# Patient Record
Sex: Male | Born: 1970 | Race: White | Hispanic: No | Marital: Single | Smoking: Former smoker
Health system: Southern US, Community
[De-identification: ages and names within clinical notes are randomized; demographics above are authoritative.]

## PROBLEM LIST (undated history)

## (undated) DIAGNOSIS — C801 Malignant (primary) neoplasm, unspecified: Secondary | ICD-10-CM

## (undated) HISTORY — PX: COLON SURGERY: SHX602

## (undated) HISTORY — DX: Malignant (primary) neoplasm, unspecified: C80.1

---

## 2004-09-14 HISTORY — PX: COLON SURGERY: SHX602

## 2004-10-15 HISTORY — PX: COLON SURGERY: SHX602

## 2005-05-28 ENCOUNTER — Emergency Department: Payer: Self-pay | Admitting: Emergency Medicine

## 2005-06-02 ENCOUNTER — Ambulatory Visit: Payer: Self-pay | Admitting: Internal Medicine

## 2005-06-11 ENCOUNTER — Ambulatory Visit: Payer: Self-pay | Admitting: Internal Medicine

## 2005-06-17 ENCOUNTER — Ambulatory Visit: Payer: Self-pay | Admitting: Internal Medicine

## 2005-07-02 ENCOUNTER — Ambulatory Visit: Payer: Self-pay | Admitting: Oncology

## 2005-07-15 ENCOUNTER — Ambulatory Visit: Payer: Self-pay | Admitting: Oncology

## 2005-08-14 ENCOUNTER — Ambulatory Visit: Payer: Self-pay | Admitting: Oncology

## 2005-09-14 ENCOUNTER — Ambulatory Visit: Payer: Self-pay | Admitting: Oncology

## 2005-12-09 ENCOUNTER — Ambulatory Visit: Payer: Self-pay | Admitting: Oncology

## 2005-12-13 ENCOUNTER — Ambulatory Visit: Payer: Self-pay | Admitting: Oncology

## 2006-01-12 ENCOUNTER — Ambulatory Visit: Payer: Self-pay | Admitting: Oncology

## 2006-02-12 ENCOUNTER — Ambulatory Visit: Payer: Self-pay | Admitting: Oncology

## 2006-03-14 ENCOUNTER — Ambulatory Visit: Payer: Self-pay | Admitting: Oncology

## 2006-04-14 ENCOUNTER — Ambulatory Visit: Payer: Self-pay | Admitting: Oncology

## 2006-05-15 ENCOUNTER — Ambulatory Visit: Payer: Self-pay | Admitting: Oncology

## 2006-08-23 ENCOUNTER — Ambulatory Visit: Payer: Self-pay | Admitting: Oncology

## 2006-09-14 ENCOUNTER — Ambulatory Visit: Payer: Self-pay | Admitting: Oncology

## 2006-11-16 DIAGNOSIS — Z720 Tobacco use: Secondary | ICD-10-CM | POA: Insufficient documentation

## 2006-12-14 ENCOUNTER — Ambulatory Visit: Payer: Self-pay | Admitting: Oncology

## 2006-12-15 ENCOUNTER — Ambulatory Visit: Payer: Self-pay | Admitting: Oncology

## 2006-12-20 ENCOUNTER — Ambulatory Visit: Payer: Self-pay | Admitting: Oncology

## 2007-01-13 ENCOUNTER — Ambulatory Visit: Payer: Self-pay | Admitting: Oncology

## 2007-04-15 ENCOUNTER — Ambulatory Visit: Payer: Self-pay | Admitting: Oncology

## 2007-04-25 ENCOUNTER — Ambulatory Visit: Payer: Self-pay | Admitting: Oncology

## 2007-05-16 ENCOUNTER — Ambulatory Visit: Payer: Self-pay | Admitting: Oncology

## 2007-06-21 ENCOUNTER — Ambulatory Visit: Payer: Self-pay | Admitting: Gastroenterology

## 2008-07-23 ENCOUNTER — Ambulatory Visit: Payer: Self-pay | Admitting: Oncology

## 2008-07-25 ENCOUNTER — Ambulatory Visit: Payer: Self-pay | Admitting: Oncology

## 2008-08-14 ENCOUNTER — Ambulatory Visit: Payer: Self-pay | Admitting: Oncology

## 2009-02-12 ENCOUNTER — Ambulatory Visit: Payer: Self-pay | Admitting: Oncology

## 2009-02-13 ENCOUNTER — Ambulatory Visit: Payer: Self-pay | Admitting: Oncology

## 2009-03-14 ENCOUNTER — Ambulatory Visit: Payer: Self-pay | Admitting: Oncology

## 2009-12-13 ENCOUNTER — Ambulatory Visit: Payer: Self-pay | Admitting: Oncology

## 2009-12-23 ENCOUNTER — Ambulatory Visit: Payer: Self-pay | Admitting: Oncology

## 2010-01-12 ENCOUNTER — Ambulatory Visit: Payer: Self-pay | Admitting: Oncology

## 2012-11-21 ENCOUNTER — Other Ambulatory Visit: Payer: Self-pay | Admitting: Unknown Physician Specialty

## 2012-11-22 LAB — CLOSTRIDIUM DIFFICILE BY PCR

## 2012-12-29 ENCOUNTER — Ambulatory Visit: Payer: Self-pay | Admitting: Unknown Physician Specialty

## 2013-06-02 ENCOUNTER — Ambulatory Visit: Payer: Self-pay | Admitting: Family Medicine

## 2014-09-20 ENCOUNTER — Ambulatory Visit: Payer: Self-pay | Admitting: Oncology

## 2014-09-20 DIAGNOSIS — R197 Diarrhea, unspecified: Secondary | ICD-10-CM | POA: Insufficient documentation

## 2014-09-20 DIAGNOSIS — K91858 Other complications of intestinal pouch: Secondary | ICD-10-CM | POA: Insufficient documentation

## 2014-09-20 DIAGNOSIS — Z9889 Other specified postprocedural states: Secondary | ICD-10-CM

## 2014-09-20 DIAGNOSIS — Z85048 Personal history of other malignant neoplasm of rectum, rectosigmoid junction, and anus: Secondary | ICD-10-CM | POA: Insufficient documentation

## 2014-09-20 DIAGNOSIS — D126 Benign neoplasm of colon, unspecified: Secondary | ICD-10-CM | POA: Insufficient documentation

## 2014-09-20 DIAGNOSIS — D369 Benign neoplasm, unspecified site: Secondary | ICD-10-CM | POA: Insufficient documentation

## 2014-10-08 ENCOUNTER — Ambulatory Visit: Payer: Self-pay | Admitting: Oncology

## 2014-10-08 LAB — COMPREHENSIVE METABOLIC PANEL
ALBUMIN: 3.6 g/dL (ref 3.4–5.0)
ANION GAP: 4 — AB (ref 7–16)
AST: 37 U/L (ref 15–37)
Alkaline Phosphatase: 61 U/L
BILIRUBIN TOTAL: 0.2 mg/dL (ref 0.2–1.0)
BUN: 14 mg/dL (ref 7–18)
CREATININE: 1.23 mg/dL (ref 0.60–1.30)
Calcium, Total: 8.5 mg/dL (ref 8.5–10.1)
Chloride: 108 mmol/L — ABNORMAL HIGH (ref 98–107)
Co2: 27 mmol/L (ref 21–32)
EGFR (African American): >60
EGFR (Non-African Amer.): >60
GLUCOSE: 89 mg/dL (ref 65–99)
Osmolality: 277 (ref 275–301)
POTASSIUM: 4.1 mmol/L (ref 3.5–5.1)
SGPT (ALT): 45 U/L
SODIUM: 139 mmol/L (ref 136–145)
TOTAL PROTEIN: 7 g/dL (ref 6.4–8.2)

## 2014-10-08 LAB — CBC CANCER CENTER
BASOS PCT: 0.4 %
Basophil #: 0 x10 3/mm (ref 0.0–0.1)
EOS ABS: 0.2 x10 3/mm (ref 0.0–0.7)
Eosinophil %: 2.2 %
HCT: 44.6 % (ref 40.0–52.0)
HGB: 15.1 g/dL (ref 13.0–18.0)
LYMPHS ABS: 2.6 x10 3/mm (ref 1.0–3.6)
Lymphocyte %: 22.8 %
MCH: 30.6 pg (ref 26.0–34.0)
MCHC: 33.8 g/dL (ref 32.0–36.0)
MCV: 91 fL (ref 80–100)
MONO ABS: 0.8 x10 3/mm (ref 0.2–1.0)
Monocyte %: 7.4 %
NEUTROS ABS: 7.7 x10 3/mm — AB (ref 1.4–6.5)
Neutrophil %: 67.2 %
Platelet: 218 x10 3/mm (ref 150–440)
RBC: 4.92 10*6/uL (ref 4.40–5.90)
RDW: 13.3 % (ref 11.5–14.5)
WBC: 11.4 x10 3/mm — AB (ref 3.8–10.6)

## 2014-10-09 LAB — CEA: CEA: 4.8 ng/mL — ABNORMAL HIGH (ref 0.0–4.7)

## 2014-10-15 ENCOUNTER — Ambulatory Visit: Payer: Self-pay | Admitting: Oncology

## 2014-11-28 ENCOUNTER — Ambulatory Visit
Admit: 2014-11-28 | Disposition: A | Discharge status: Home / Self Care | Payer: Self-pay | Attending: Oncology | Admitting: Oncology

## 2014-12-14 ENCOUNTER — Ambulatory Visit
Admit: 2014-12-14 | Disposition: A | Discharge status: Home / Self Care | Payer: Self-pay | Attending: Oncology | Admitting: Oncology

## 2015-03-11 ENCOUNTER — Ambulatory Visit (INDEPENDENT_AMBULATORY_CARE_PROVIDER_SITE_OTHER): Payer: Federal, State, Local not specified - PPO | Admitting: Family Medicine

## 2015-03-11 ENCOUNTER — Encounter: Payer: Self-pay | Admitting: Family Medicine

## 2015-03-11 VITALS — BP 108/56 | HR 79 | Temp 98.1°F | Resp 16 | Ht 75.0 in | Wt 208.8 lb

## 2015-03-11 DIAGNOSIS — Z85038 Personal history of other malignant neoplasm of large intestine: Secondary | ICD-10-CM | POA: Insufficient documentation

## 2015-03-11 DIAGNOSIS — Z0000 Encounter for general adult medical examination without abnormal findings: Secondary | ICD-10-CM | POA: Diagnosis not present

## 2015-03-11 NOTE — Progress Notes (Signed)
Subjective:     Patient ID: Sean Bates, male   DOB: [birth year], 44 y.o.   MRN: [id number]  HPI  Chief Complaint  Patient presents with  . Annual Exam    Patient is present in office today for his yearly exam he states that he has no questions or concerns at this time.   States he needs form filled out for [employer]: Feeling well; Does not wish to stop smoking at this time. HEENT: regular eye exam. He is pending extraction of his teeth for dentures. Cardiovascular: no chest pain, shortness of breath, or palpitations GI: no heartburn, no change in bowel habits or blood in the stool. He has consulted with G.I. Doctor in [place] who has rx'ed opium to slow down his frequent diarrhea from 15-20 x day to 8-15 x day. 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 GU: nocturia x 1, no change in bladder habits  Psychiatric: not depressed; PHQ 2: 0 Musculoskeletal: no joint pain. Sees a chiropractor for back pain and uses muscle relaxants occasionally.     Objective:   Physical Exam Eyes: PERRLA Neck: no thyromegaly, tenderness or nodules,  ENT: TM's intact without inflammation; No tonsillar enlargement or exudate, Lungs: Clear Heart : RRR without murmur or gallop Abd: bowel sounds present, soft, non-tender, no organomegaly Skin: no atypical lesions noted.    Assessment:    1. Annual physical exam - Lipid panel; Future - Comprehensive metabolic panel; Future    Plan:    Further f/u pending lab work. [employer] form completed

## 2015-03-11 NOTE — Patient Instructions (Signed)
We will call you with lab results.       

## 2015-10-11 ENCOUNTER — Other Ambulatory Visit: Payer: Self-pay | Admitting: *Deleted

## 2015-10-11 DIAGNOSIS — Z85038 Personal history of other malignant neoplasm of large intestine: Secondary | ICD-10-CM

## 2015-10-14 ENCOUNTER — Inpatient Hospital Stay: Payer: Federal, State, Local not specified - PPO

## 2015-10-14 ENCOUNTER — Inpatient Hospital Stay: Payer: Federal, State, Local not specified - PPO | Admitting: Oncology

## 2015-11-19 ENCOUNTER — Ambulatory Visit: Payer: Self-pay | Admitting: Oncology

## 2015-11-19 ENCOUNTER — Other Ambulatory Visit: Payer: Self-pay

## 2015-12-17 ENCOUNTER — Ambulatory Visit (INDEPENDENT_AMBULATORY_CARE_PROVIDER_SITE_OTHER): Payer: Federal, State, Local not specified - PPO | Admitting: Family Medicine

## 2015-12-17 ENCOUNTER — Encounter: Payer: Self-pay | Admitting: Family Medicine

## 2015-12-17 VITALS — BP 96/58 | HR 64 | Temp 98.2°F | Resp 16 | Wt 205.0 lb

## 2015-12-17 DIAGNOSIS — J0100 Acute maxillary sinusitis, unspecified: Secondary | ICD-10-CM | POA: Diagnosis not present

## 2015-12-17 DIAGNOSIS — Z85038 Personal history of other malignant neoplasm of large intestine: Secondary | ICD-10-CM | POA: Diagnosis not present

## 2015-12-17 MED ORDER — CEFDINIR 300 MG PO CAPS: 300.0000 mg | ORAL_CAPSULE | Freq: Two times a day (BID) | ORAL | Status: DC

## 2015-12-17 NOTE — Patient Instructions (Signed)
Discussed use of Mucinex D. 

## 2015-12-17 NOTE — Progress Notes (Signed)
Subjective:     Patient ID: Sean Bates, male   DOB: 12-Apr-1971, 45 y.o.   MRN: [id number]  HPI  Chief Complaint  Patient presents with  . Facial Pain    Paitent comes in office today with concerns of sinus pain on the right side of his face. Patient reports that in begining of March he had tooth extraced and was placed on two rounds of antibiotics but states that it has not helped none with sinus pain. Patient reports that pain is located exactly where tooth was extracted.   States after the extraction there was communication between the tooth cavity and the right maxillary sinus. Reports persistent right sided sinus pain, left sided congestion and purulent drainage. Reports treatment with amoxicillin with transient improvement.   Review of Systems     Objective:   Physical Exam  Constitutional: He appears well-developed and well-nourished. No distress.  Ears: T.M's intact without inflammation Sinuses: right maxillary sinus tenderness Throat: no tonsillar enlargement or exudate. There is a deep tooth cavity apparent in his right upper molar area. Neck: no cervical adenopathy Lungs: clear    Assessment:    1. Acute maxillary sinusitis, recurrence not specified - cefdinir (OMNICEF) 300 MG capsule; Take 1 capsule (300 mg total) by mouth 2 (two) times daily.  Dispense: 20 capsule; Refill: 0    Plan:    Discussed use of Mucinex D. Minimize use of nasal decongestant sprays.

## 2016-03-23 ENCOUNTER — Ambulatory Visit (INDEPENDENT_AMBULATORY_CARE_PROVIDER_SITE_OTHER): Payer: Federal, State, Local not specified - PPO | Admitting: Family Medicine

## 2016-03-23 ENCOUNTER — Encounter: Payer: Self-pay | Admitting: Family Medicine

## 2016-03-23 VITALS — BP 112/60 | HR 68 | Temp 97.9°F | Resp 16 | Ht 76.0 in | Wt 203.0 lb

## 2016-03-23 DIAGNOSIS — Z0000 Encounter for general adult medical examination without abnormal findings: Secondary | ICD-10-CM

## 2016-03-23 DIAGNOSIS — Z13220 Encounter for screening for lipoid disorders: Secondary | ICD-10-CM

## 2016-03-23 DIAGNOSIS — Z131 Encounter for screening for diabetes mellitus: Secondary | ICD-10-CM

## 2016-03-23 NOTE — Progress Notes (Signed)
Subjective:     Patient ID: Sean Bates, male   DOB: [birth year], 45 y.o.   MRN: [id number]  HPI  Chief Complaint  Patient presents with  . Annual Exam  Also wishes [employer] Form completed. States he is working on smoking cessation with the use of an otc herbal remedy. Reports trying other rx and otc products in the past with little improvement. 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   Review of Systems General: Feeling well, Tdap in 2009 HEENT: States he uses otc reading glasses-last eye exam 2 years ago. He is waiting for full upper dentures and partial in his lower teeth. Cardiovascular: no chest pain, shortness of breath, or palpitations GI: no heartburn, no change in bowel habits. States he sees G.I. Sean Bates) annually for colo-rectal cancer surveillance. Sees a doctor in [place] as well for ex of Opium extract to help lessen stool frequency. He elected not to have a colostomy bag after colectomy. GU: nocturia x 0, no change in bladder habits, no hernia Psychiatric: not depressed Musculoskeletal: Occasionally sees a chiropractor, Dr. Emmit Bates, for back pains.    Objective:   Physical Exam Eyes: PERRLA Neck: no thyromegaly, tenderness or nodules,  No cervical adenopathy ENT: TM's intact without inflammation; No tonsillar enlargement or exudate, Lungs: Clear Heart : RRR without murmur or gallop Abd: bowel sounds present, soft, non-tender, no organomegaly Extremities: no edema     Assessment:    1. Annual physical exam  2. Screening for cholesterol level - Lipid panel  3. Screening for diabetes mellitus - Comprehensive metabolic panel    Plan:    Further f/u pending lab work.

## 2016-03-23 NOTE — Patient Instructions (Signed)
We will call you with the lab results. 

## 2016-03-25 ENCOUNTER — Ambulatory Visit (INDEPENDENT_AMBULATORY_CARE_PROVIDER_SITE_OTHER): Payer: Federal, State, Local not specified - PPO | Admitting: Family Medicine

## 2016-03-25 ENCOUNTER — Encounter: Payer: Self-pay | Admitting: Family Medicine

## 2016-03-25 VITALS — BP 100/72 | HR 76 | Temp 97.7°F | Resp 16 | Wt 202.0 lb

## 2016-03-25 DIAGNOSIS — W57XXXA Bitten or stung by nonvenomous insect and other nonvenomous arthropods, initial encounter: Secondary | ICD-10-CM

## 2016-03-25 DIAGNOSIS — S90862A Insect bite (nonvenomous), left foot, initial encounter: Secondary | ICD-10-CM

## 2016-03-25 MED ORDER — FLUOCINONIDE 0.05 % EX CREA: 1.0000 "application " | TOPICAL_CREAM | Freq: Three times a day (TID) | CUTANEOUS | Status: DC

## 2016-03-25 NOTE — Patient Instructions (Signed)
Let us know if flu like symptoms or rash is not improving.

## 2016-03-25 NOTE — Progress Notes (Signed)
Subjective:     Patient ID: Sean Bates, male   DOB: [birth year], 45 y.o.   MRN: [id number]  HPI  Chief Complaint  Patient presents with  . Insect Bite    Tick bite on top of left foot 1 week ago. No N/V, headache, fever. Is concerned today because there is a "bull's eye" around the tick bite.  States he removed the tick within one hour of attachment. Has applied abx cream and hydrogen peroxide.   Review of Systems     Objective:   Physical Exam  Constitutional: He appears well-developed and well-nourished. No distress.  Skin:  Dorsum of left foot with flesh colored insect bite without f.b. Visualized. There is a non-confluent inflammatory flare around the bite.       Assessment:    1. Tick bite of foot, left, initial encounter: time of attachment precludes transmission of tick fever. - fluocinonide cream (LIDEX) 0.05 %; Apply 1 application topically 3 (three) times daily. To poison ivy rash  Dispense: 15 g; Refill: 0    Plan:    Call or return as needed for tick fever symptoms as has opportunity for exposure.

## 2016-03-27 ENCOUNTER — Telehealth: Payer: Self-pay

## 2016-03-27 LAB — COMPREHENSIVE METABOLIC PANEL
ALBUMIN: 4.1 g/dL (ref 3.5–5.5)
ALT: 20 IU/L (ref 0–44)
AST: 20 IU/L (ref 0–40)
Albumin/Globulin Ratio: 1.5 (ref 1.2–2.2)
Alkaline Phosphatase: 65 IU/L (ref 39–117)
BUN / CREAT RATIO: 15 (ref 9–20)
BUN: 17 mg/dL (ref 6–24)
Bilirubin Total: 0.3 mg/dL (ref 0.0–1.2)
CO2: 23 mmol/L (ref 18–29)
CREATININE: 1.12 mg/dL (ref 0.76–1.27)
Calcium: 9.7 mg/dL (ref 8.7–10.2)
Chloride: 101 mmol/L (ref 96–106)
GFR, EST AFRICAN AMERICAN: 91 mL/min/{1.73_m2} (ref >59)
GFR, EST NON AFRICAN AMERICAN: 78 mL/min/{1.73_m2} (ref >59)
GLUCOSE: 88 mg/dL (ref 65–99)
Globulin, Total: 2.7 g/dL (ref 1.5–4.5)
Potassium: 4.4 mmol/L (ref 3.5–5.2)
Sodium: 142 mmol/L (ref 134–144)
TOTAL PROTEIN: 6.8 g/dL (ref 6.0–8.5)

## 2016-03-27 LAB — LIPID PANEL
Chol/HDL Ratio: 3.7 ratio units (ref 0.0–5.0)
Cholesterol, Total: 143 mg/dL (ref 100–199)
HDL: 39 mg/dL — ABNORMAL LOW (ref >39)
LDL CALC: 84 mg/dL (ref 0–99)
Triglycerides: 100 mg/dL (ref 0–149)
VLDL CHOLESTEROL CAL: 20 mg/dL (ref 5–40)

## 2016-03-27 NOTE — Telephone Encounter (Signed)
Patient advised as below.  

## 2016-03-27 NOTE — Telephone Encounter (Signed)
-----   Message from Carmon Ginsberg, [place] sent at 03/27/2016  7:40 AM EDT ----- Labs look good: repeat annually

## 2016-05-25 ENCOUNTER — Other Ambulatory Visit
Admission: RE | Admit: 2016-05-25 | Discharge: 2016-05-25 | Disposition: A | Discharge status: Home / Self Care | Payer: Federal, State, Local not specified - PPO | Source: Ambulatory Visit | Attending: Nurse Practitioner | Admitting: Nurse Practitioner

## 2016-05-25 DIAGNOSIS — R197 Diarrhea, unspecified: Secondary | ICD-10-CM | POA: Insufficient documentation

## 2016-05-25 DIAGNOSIS — Z9889 Other specified postprocedural states: Secondary | ICD-10-CM | POA: Insufficient documentation

## 2016-05-25 LAB — CLOSTRIDIUM DIFFICILE BY PCR: Toxigenic C. Difficile by PCR: NEGATIVE

## 2016-05-25 LAB — GASTROINTESTINAL PANEL BY PCR, STOOL (REPLACES STOOL CULTURE)

## 2016-05-25 LAB — C DIFFICILE QUICK SCREEN W PCR REFLEX
C Diff antigen: POSITIVE — AB
C Diff toxin: NEGATIVE

## 2016-12-16 ENCOUNTER — Other Ambulatory Visit: Payer: Self-pay | Admitting: Orthopedic Surgery

## 2016-12-16 DIAGNOSIS — M5431 Sciatica, right side: Secondary | ICD-10-CM

## 2016-12-23 ENCOUNTER — Ambulatory Visit (HOSPITAL_COMMUNITY)
Admission: RE | Admit: 2016-12-23 | Discharge: 2016-12-23 | Disposition: A | Discharge status: Home / Self Care | Payer: Federal, State, Local not specified - PPO | Source: Ambulatory Visit | Attending: Orthopedic Surgery | Admitting: Orthopedic Surgery

## 2016-12-23 DIAGNOSIS — M48061 Spinal stenosis, lumbar region without neurogenic claudication: Secondary | ICD-10-CM | POA: Diagnosis not present

## 2016-12-23 DIAGNOSIS — M5431 Sciatica, right side: Secondary | ICD-10-CM | POA: Insufficient documentation

## 2016-12-23 DIAGNOSIS — M5126 Other intervertebral disc displacement, lumbar region: Secondary | ICD-10-CM | POA: Insufficient documentation

## 2017-02-02 DIAGNOSIS — M5126 Other intervertebral disc displacement, lumbar region: Secondary | ICD-10-CM | POA: Diagnosis not present

## 2017-02-02 DIAGNOSIS — M5416 Radiculopathy, lumbar region: Secondary | ICD-10-CM | POA: Diagnosis not present

## 2017-03-23 DIAGNOSIS — M5126 Other intervertebral disc displacement, lumbar region: Secondary | ICD-10-CM | POA: Diagnosis not present

## 2017-03-23 DIAGNOSIS — M5416 Radiculopathy, lumbar region: Secondary | ICD-10-CM | POA: Diagnosis not present

## 2017-04-28 DIAGNOSIS — M5126 Other intervertebral disc displacement, lumbar region: Secondary | ICD-10-CM | POA: Diagnosis not present

## 2017-04-28 DIAGNOSIS — M5431 Sciatica, right side: Secondary | ICD-10-CM | POA: Diagnosis not present

## 2017-07-04 DIAGNOSIS — L509 Urticaria, unspecified: Secondary | ICD-10-CM | POA: Diagnosis not present

## 2017-07-04 DIAGNOSIS — W540XXA Bitten by dog, initial encounter: Secondary | ICD-10-CM | POA: Diagnosis not present

## 2017-07-04 DIAGNOSIS — S61451A Open bite of right hand, initial encounter: Secondary | ICD-10-CM | POA: Diagnosis not present

## 2018-07-18 DIAGNOSIS — M5126 Other intervertebral disc displacement, lumbar region: Secondary | ICD-10-CM | POA: Diagnosis not present

## 2018-07-18 DIAGNOSIS — M5416 Radiculopathy, lumbar region: Secondary | ICD-10-CM | POA: Diagnosis not present

## 2018-12-16 ENCOUNTER — Other Ambulatory Visit: Payer: Self-pay

## 2018-12-16 ENCOUNTER — Encounter: Payer: Self-pay | Admitting: Family Medicine

## 2018-12-16 ENCOUNTER — Ambulatory Visit: Payer: Federal, State, Local not specified - PPO | Admitting: Family Medicine

## 2018-12-16 VITALS — BP 121/71 | HR 77 | Temp 98.1°F | Resp 16 | Wt 219.8 lb

## 2018-12-16 DIAGNOSIS — H6981 Other specified disorders of Eustachian tube, right ear: Secondary | ICD-10-CM | POA: Diagnosis not present

## 2018-12-16 DIAGNOSIS — K047 Periapical abscess without sinus: Secondary | ICD-10-CM | POA: Diagnosis not present

## 2018-12-16 MED ORDER — AMOXICILLIN 875 MG PO TABS
875.0000 mg | ORAL_TABLET | Freq: Two times a day (BID) | ORAL | 0 refills | Status: DC
Start: 2018-12-16 — End: 2019-12-21

## 2018-12-16 NOTE — Progress Notes (Signed)
Patient: Sean Bates Male    DOB: June 02, 1971   48 y.o.   MRN: [id number] Visit Date: 12/16/2018  Today's Provider: Vernie Murders, PA   Chief Complaint  Patient presents with  . Ear Pain   Subjective:     Otalgia   There is pain in the right ear. This is a new problem. The current episode started in the past 7 days. The problem occurs constantly. The problem has been unchanged. There has been no fever. The pain is moderate. Associated symptoms include headaches, neck pain and rhinorrhea. Pertinent negatives include no abdominal pain, coughing, diarrhea, ear discharge, hearing loss, rash, sore throat or vomiting. He has tried acetaminophen and NSAIDs (zyrtec, nyquil, benadryl) for the symptoms. The treatment provided no relief.   Past Medical History:  Diagnosis Date  . Cancer [facility])    history of colon   Past Surgical History:  Procedure Laterality Date  . COLON SURGERY  2006   Family History  Problem Relation Age of Onset  . Cancer Paternal Aunt   . Asthma Maternal Grandmother   . Cancer Paternal Grandmother   . Healthy Mother   . Diabetes Father    No Known Allergies  Current Outpatient Medications:  .  aspirin 325 MG tablet, Take by mouth., Disp: , Rfl:  .  Cholecalciferol (VITAMIN D-3 PO), Take by mouth., Disp: , Rfl:  .  cholestyramine light (PREVALITE) 4 GM/DOSE powder, , Disp: , Rfl:  .  Multiple Vitamin (MULTIVITAMIN) capsule, Take 1 capsule by mouth daily., Disp: , Rfl:   Review of Systems  Constitutional: Negative.   HENT: Positive for ear pain and rhinorrhea. Negative for ear discharge, hearing loss and sore throat.   Eyes: Negative.   Respiratory: Negative.  Negative for cough.   Cardiovascular: Negative.   Gastrointestinal: Negative.  Negative for abdominal pain, diarrhea and vomiting.  Endocrine: Negative.   Genitourinary: Negative.   Musculoskeletal: Positive for neck pain.  Skin: Negative.  Negative for rash.  Allergic/Immunologic: Negative.    Neurological: Positive for headaches.  Hematological: Negative.   Psychiatric/Behavioral: Negative.    Social History   Tobacco Use  . Smoking status: Current Every Day Smoker    Packs/day: 1.00    Years: 22.00    Pack years: 22.00    Types: Cigarettes  . Smokeless tobacco: Former Systems developer    Quit date: 09/13/1986  Substance Use Topics  . Alcohol use: Yes    Alcohol/week: 0.0 standard drinks    Comment: occasional     Objective:   BP 121/71   Pulse 77   Temp 98.1 F (36.7 C) (Oral)   Resp 16   Wt 219 lb 12.8 oz (99.7 kg)   BMI 26.75 kg/m  Vitals:   12/16/18 1431  BP: 121/71  Pulse: 77  Resp: 16  Temp: 98.1 F (36.7 C)  TempSrc: Oral  Weight: 219 lb 12.8 oz (99.7 kg)   Physical Exam Constitutional:      General: He is not in acute distress.    Appearance: He is well-developed.  HENT:     Head: Normocephalic and atraumatic.     Right Ear: Hearing and tympanic membrane normal.     Left Ear: Hearing and tympanic membrane normal.     Nose: Nose normal.     Mouth/Throat:     Comments: Tender right lower jaw with badly decayed tooth that is broken. Eyes:     General: Lids are normal. No scleral icterus.  Right eye: No discharge.        Left eye: No discharge.     Conjunctiva/sclera: Conjunctivae normal.  Neck:     Musculoskeletal: Neck supple.  Cardiovascular:     Rate and Rhythm: Normal rate and regular rhythm.     Heart sounds: Normal heart sounds.  Pulmonary:     Effort: Pulmonary effort is normal. No respiratory distress.  Musculoskeletal: Normal range of motion.  Lymphadenopathy:     Cervical: No cervical adenopathy.  Skin:    Findings: No lesion or rash.  Neurological:     Mental Status: He is alert and oriented to person, place, and time.  Psychiatric:        Speech: Speech normal.        Behavior: Behavior normal.        Thought Content: Thought content normal.       Assessment & Plan    1. Acute dysfunction of right eustachian tube  Onset with a little stopped up sensation in the right ear and pain down to the lower jaw. No TMJ tenderness, crepitus or locking/popping. May use Zyrtec and ASA as needed. Add Flonase or Nasonex Nasal Spray at bedtime for allergic rhinitis and eustachian dysfunction. Recheck prn if fever develops.  2. Tooth abscess Severe decay of a right lower jaw tooth that is tender to palpate. No local lymphadenopathy. Will treat with Amoxil for infection and may use Ambesol for tooth pain prn. Should proceed with dental evaluation when possible with recent COVID-19 restrictions. - amoxicillin (AMOXIL) 875 MG tablet; Take 1 tablet (875 mg total) by mouth 2 (two) times daily.  Dispense: 20 tablet; Refill: 0     Vernie Murders, [facility] Fritzi Mandes [place] as a Education administrator for [employer], [employer].,have documented all relevant documentation on the behalf of Vernie Murders, PA,as directed by  [employer] while in the presence of [employer], [place].

## 2019-05-11 DIAGNOSIS — Z85048 Personal history of other malignant neoplasm of rectum, rectosigmoid junction, and anus: Secondary | ICD-10-CM | POA: Diagnosis not present

## 2019-05-11 DIAGNOSIS — R197 Diarrhea, unspecified: Secondary | ICD-10-CM | POA: Diagnosis not present

## 2019-05-11 DIAGNOSIS — Z01812 Encounter for preprocedural laboratory examination: Secondary | ICD-10-CM | POA: Diagnosis not present

## 2019-05-11 DIAGNOSIS — D132 Benign neoplasm of duodenum: Secondary | ICD-10-CM | POA: Diagnosis not present

## 2019-05-24 DIAGNOSIS — Z01812 Encounter for preprocedural laboratory examination: Secondary | ICD-10-CM | POA: Diagnosis not present

## 2019-05-30 DIAGNOSIS — M1990 Unspecified osteoarthritis, unspecified site: Secondary | ICD-10-CM | POA: Diagnosis not present

## 2019-05-30 DIAGNOSIS — R197 Diarrhea, unspecified: Secondary | ICD-10-CM | POA: Diagnosis not present

## 2019-05-30 DIAGNOSIS — D126 Benign neoplasm of colon, unspecified: Secondary | ICD-10-CM | POA: Diagnosis not present

## 2019-05-30 DIAGNOSIS — K317 Polyp of stomach and duodenum: Secondary | ICD-10-CM | POA: Diagnosis not present

## 2019-05-30 DIAGNOSIS — C20 Malignant neoplasm of rectum: Secondary | ICD-10-CM | POA: Diagnosis not present

## 2019-05-30 DIAGNOSIS — Z1589 Genetic susceptibility to other disease: Secondary | ICD-10-CM | POA: Diagnosis not present

## 2019-05-30 DIAGNOSIS — K624 Stenosis of anus and rectum: Secondary | ICD-10-CM | POA: Diagnosis not present

## 2019-05-30 DIAGNOSIS — Z85048 Personal history of other malignant neoplasm of rectum, rectosigmoid junction, and anus: Secondary | ICD-10-CM | POA: Diagnosis not present

## 2019-05-30 DIAGNOSIS — D131 Benign neoplasm of stomach: Secondary | ICD-10-CM | POA: Diagnosis not present

## 2019-05-30 DIAGNOSIS — D132 Benign neoplasm of duodenum: Secondary | ICD-10-CM | POA: Diagnosis not present

## 2019-06-05 ENCOUNTER — Inpatient Hospital Stay: Payer: Federal, State, Local not specified - PPO | Admitting: Internal Medicine

## 2019-12-21 NOTE — Progress Notes (Signed)
Established patient visit      Patient: Sean Bates   DOB: 1971-07-29   49 y.o. Male  MRN: [id number] Visit Date: 12/21/2019  Today's healthcare provider: [place] Tiphanie Vo, FNP  Subjective:   No chief complaint on file.  HPI Patient presents today for a routine follow up. Mikki Santee was patient's previous PCP. Last CPE was 03/23/2016. Patient is requesting labs be done for upcoming surgery on 01/16/2020, labs are requested ny paper form from his surgeon and are to be faxed to [phone] Dr. Kathryne Gin medical, he has had progression of his colonoscopy, he is going to get a colostomy bag.   He had colon cancer surgery at [facility]. He denies any other concerns at the visit, he is glad  To be getting a colostomy he reports he has been dealing with this so long and the incontinence that he wants it done to help his symptoms. He denies any rectal bleeding or skin break down. Denies abdominal pain. '  He has not seen Carmon Ginsberg, [place] since 2017, for follow up or primary care.  He reports he will schedule a physical in the near future as advise.   He denies any pain.  He denies any dizziness, lightheaded, or pre syncope/ syncope. Patient  denies any fever, body aches,chills, rash, chest pain, shortness of breath, nausea, vomiting, or diarrhea.   Patient Active Problem List   Diagnosis Date Noted  . H/O malignant neoplasm of colon 03/11/2015  . H/O malignant neoplasm of rectum 09/20/2014  . Diarrhea following gastrointestinal surgery 09/20/2014  . Familial polyposis 09/20/2014  . Current tobacco use 11/16/2006   Past Medical History:  Diagnosis Date  . Cancer [facility])    history of colon   Past Surgical History:  Procedure Laterality Date  . COLON SURGERY  2006   No Known Allergies     Medications: Outpatient Medications Prior to Visit  Medication Sig  . aspirin 325 MG tablet Take by mouth.  . Cholecalciferol (VITAMIN D-3 PO) Take by mouth.  . cholestyramine light  (PREVALITE) 4 GM/DOSE powder   . Multiple Vitamin (MULTIVITAMIN) capsule Take 1 capsule by mouth daily.  . [DISCONTINUED] amoxicillin (AMOXIL) 875 MG tablet Take 1 tablet (875 mg total) by mouth 2 (two) times daily.   No facility-administered medications prior to visit.    Review of Systems  Constitutional: Negative.   HENT: Negative.   Respiratory: Negative.   Cardiovascular: Negative.   Gastrointestinal: Positive for diarrhea (chronic with some incontnence chronic - followed by GI ) and rectal pain (chronic followed bu GI ). Negative for abdominal distention, abdominal pain, anal bleeding, blood in stool, constipation, nausea and vomiting.  Genitourinary: Negative.   Musculoskeletal: Negative.     Last CBC Lab Results  Component Value Date   WBC 11.4 (H) 10/08/2014   HGB 15.1 10/08/2014   HCT 44.6 10/08/2014   MCV 91 10/08/2014   MCH 30.6 10/08/2014   RDW 13.3 10/08/2014   PLT 218 [id number]   Last metabolic panel Lab Results  Component Value Date   GLUCOSE 88 03/26/2016   NA 142 03/26/2016   K 4.4 03/26/2016   CL 101 03/26/2016   CO2 23 03/26/2016   BUN 17 03/26/2016   CREATININE 1.12 03/26/2016   GFRNONAA 78 03/26/2016   GFRAA 91 03/26/2016   CALCIUM 9.7 03/26/2016   PROT 6.8 03/26/2016   ALBUMIN 4.1 03/26/2016   LABGLOB 2.7 03/26/2016   AGRATIO 1.5 03/26/2016   BILITOT  0.3 03/26/2016   ALKPHOS 65 03/26/2016   AST 20 03/26/2016   ALT 20 03/26/2016   ANIONGAP 4 (L) 10/08/2014   Last lipids Lab Results  Component Value Date   CHOL 143 03/26/2016   HDL 39 (L) 03/26/2016   LDLCALC 84 03/26/2016   TRIG 100 03/26/2016   CHOLHDL 3.7 03/26/2016   Last hemoglobin A1c No results found for: HGBA1C Last thyroid functions No results found for: TSH, T3TOTAL, T4TOTAL, THYROIDAB Last vitamin D No results found for: 25OHVITD2, 25OHVITD3, VD25OH Last vitamin B12 and Folate No results found for: VITAMINB12, FOLATE      Objective:    Blood pressure 110/66,  pulse 64, temperature 97.9 F (36.6 C), temperature source Temporal, height 6\' 5"  (1.956 m), weight 222 lb 3.2 oz (100.8 kg).   Physical Exam Vitals and nursing note reviewed.  Constitutional:      General: He is not in acute distress.    Appearance: Normal appearance. He is well-developed. He is not ill-appearing, toxic-appearing or diaphoretic.     Comments: Patient is alert and oriented and responsive to questions Engages in eye contact with provider. Speaks in full sentences without any pauses without any shortness of breath or distress.    HENT:     Head: Normocephalic and atraumatic.     Right Ear: Hearing, tympanic membrane, ear canal and external ear normal.     Left Ear: Hearing, tympanic membrane, ear canal and external ear normal.     Nose: Nose normal.     Mouth/Throat:     Pharynx: Uvula midline. No oropharyngeal exudate.  Eyes:     General: Lids are normal. No scleral icterus.       Right eye: No discharge.        Left eye: No discharge.     Conjunctiva/sclera: Conjunctivae normal.     Pupils: Pupils are equal, round, and reactive to light.  Neck:     Thyroid: No thyromegaly.     Vascular: Normal carotid pulses. No carotid bruit, hepatojugular reflux or JVD.     Trachea: Trachea and phonation normal. No tracheal tenderness or tracheal deviation.     Meningeal: Brudzinski's sign absent.  Cardiovascular:     Rate and Rhythm: Normal rate and regular rhythm.     Pulses: Normal pulses.     Heart sounds: Normal heart sounds, S1 normal and S2 normal. Heart sounds not distant. No murmur. No friction rub. No gallop.   Pulmonary:     Effort: Pulmonary effort is normal. No accessory muscle usage or respiratory distress.     Breath sounds: Normal breath sounds. No stridor. No wheezing or rales.  Chest:     Chest wall: No tenderness.  Abdominal:     General: Bowel sounds are normal. There is no distension.     Palpations: Abdomen is soft. There is no mass.     Tenderness:  There is no abdominal tenderness. There is no guarding or rebound.     Hernia: No hernia is present.  Genitourinary:    Comments: Declined- sees GI- seeing specialty frequently for chronic issues. Patient denies any change.  Musculoskeletal:        General: No tenderness or deformity. Normal range of motion.     Cervical back: Full passive range of motion without pain, normal range of motion and neck supple.     Comments: Patient moves on and off of exam table and in room without difficulty. Gait is normal in hall and in room.  Patient is oriented to person place time and situation. Patient answers questions appropriately and engages in conversation.   Lymphadenopathy:     Head:     Right side of head: No submental, submandibular, tonsillar, preauricular, posterior auricular or occipital adenopathy.     Left side of head: No submental, submandibular, tonsillar, preauricular, posterior auricular or occipital adenopathy.     Cervical: No cervical adenopathy.  Skin:    General: Skin is warm and dry.     Capillary Refill: Capillary refill takes less than 2 seconds.     Coloration: Skin is not pale.     Findings: No erythema or rash.     Nails: There is no clubbing.  Neurological:     Mental Status: He is alert and oriented to person, place, and time.     GCS: GCS eye subscore is 4. GCS verbal subscore is 5. GCS motor subscore is 6.     Cranial Nerves: No cranial nerve deficit.     Sensory: No sensory deficit.     Motor: No abnormal muscle tone.     Coordination: Coordination normal.     Gait: Gait normal.     Deep Tendon Reflexes: Reflexes are normal and symmetric. Reflexes normal.  Psychiatric:        Speech: Speech normal.        Behavior: Behavior normal.        Thought Content: Thought content normal.        Judgment: Judgment normal.       No results found for any visits on 12/22/19.    Assessment & Plan:     H/O malignant neoplasm of colon  Body mass index 26.0-26.9,  adult - Plan: CBC with Differential/Platelet, Comprehensive Metabolic Panel (CMET)  H/O malignant neoplasm of rectum  Current tobacco use - Plan: CBC with Differential/Platelet, Comprehensive Metabolic Panel (CMET)  Screening for thyroid disorder - Plan: CBC with Differential/Platelet, Comprehensive Metabolic Panel (CMET), TSH  Malignant neoplasm of colon, unspecified part of colon ([place]) - Plan: PTT, INR/PT, Urinalysis, microscopic only, Urine Culture, TSH, Lipid Panel w/o Chol/HDL Ratio  Screening cholesterol level - Plan: CBC with Differential/Platelet, Comprehensive Metabolic Panel (CMET)  Screening for blood or protein in urine - Plan: Urinalysis, microscopic only, Urine Culture  Needs above labs, fax to Dr. Harrington Challenger in [place] number is in HPI.  He denies any other concerns, was here for labs, will call with results and fax as requested.  Discussed importance of health maintenance and scheduling follow up physical.   Return in about 2 weeks (around 01/05/2020), or if symptoms worsen or fail to improve, for at any time for any worsening symptoms, Go to Emergency room/ urgent care if worse.  The entirety of the information documented in the History of Present Illness, Review of Systems and Physical Exam were personally obtained by me. Portions of this information were initially documented by the  Certified Medical Assistant whose name is documented in [place] and reviewed by me for thoroughness and accuracy.  I have personally performed the exam and reviewed the chart and it is accurate to the best of my knowledge.  Haematologist has been used and any errors in dictation or transcription are unintentional.  Kelby Aline. Thanvi Blincoe FNP-C  [facility]  .      Marcille Buffy, [place] [phone] (phone) [phone] (fax)  [place]

## 2019-12-22 ENCOUNTER — Other Ambulatory Visit: Payer: Self-pay

## 2019-12-22 ENCOUNTER — Ambulatory Visit: Payer: Federal, State, Local not specified - PPO | Admitting: Adult Health

## 2019-12-22 ENCOUNTER — Encounter: Payer: Self-pay | Admitting: Adult Health

## 2019-12-22 VITALS — BP 110/66 | HR 64 | Temp 97.9°F | Ht 77.0 in | Wt 222.2 lb

## 2019-12-22 DIAGNOSIS — Z85048 Personal history of other malignant neoplasm of rectum, rectosigmoid junction, and anus: Secondary | ICD-10-CM

## 2019-12-22 DIAGNOSIS — Z1329 Encounter for screening for other suspected endocrine disorder: Secondary | ICD-10-CM

## 2019-12-22 DIAGNOSIS — Z1389 Encounter for screening for other disorder: Secondary | ICD-10-CM

## 2019-12-22 DIAGNOSIS — Z13220 Encounter for screening for lipoid disorders: Secondary | ICD-10-CM | POA: Diagnosis not present

## 2019-12-22 DIAGNOSIS — C189 Malignant neoplasm of colon, unspecified: Secondary | ICD-10-CM

## 2019-12-22 DIAGNOSIS — Z720 Tobacco use: Secondary | ICD-10-CM | POA: Diagnosis not present

## 2019-12-22 DIAGNOSIS — Z6826 Body mass index (BMI) 26.0-26.9, adult: Secondary | ICD-10-CM

## 2019-12-22 DIAGNOSIS — Z85038 Personal history of other malignant neoplasm of large intestine: Secondary | ICD-10-CM

## 2019-12-22 NOTE — Patient Instructions (Signed)

## 2019-12-23 LAB — PROTIME-INR
INR: 0.9 (ref 0.9–1.2)
Prothrombin Time: 10 s (ref 9.1–12.0)

## 2019-12-23 LAB — COMPREHENSIVE METABOLIC PANEL
ALT: 18 IU/L (ref 0–44)
AST: 20 IU/L (ref 0–40)
Albumin/Globulin Ratio: 1.5 (ref 1.2–2.2)
Albumin: 3.8 g/dL — ABNORMAL LOW (ref 4.0–5.0)
Alkaline Phosphatase: 70 IU/L (ref 39–117)
BUN/Creatinine Ratio: 15 (ref 9–20)
BUN: 14 mg/dL (ref 6–24)
Bilirubin Total: <0.2 mg/dL (ref 0.0–1.2)
CO2: 20 mmol/L (ref 20–29)
Calcium: 9 mg/dL (ref 8.7–10.2)
Chloride: 107 mmol/L — ABNORMAL HIGH (ref 96–106)
Creatinine, Ser: 0.93 mg/dL (ref 0.76–1.27)
GFR calc Af Amer: 111 mL/min/{1.73_m2} (ref >59)
GFR calc non Af Amer: 96 mL/min/{1.73_m2} (ref >59)
Globulin, Total: 2.5 g/dL (ref 1.5–4.5)
Glucose: 93 mg/dL (ref 65–99)
Potassium: 4.3 mmol/L (ref 3.5–5.2)
Sodium: 140 mmol/L (ref 134–144)
Total Protein: 6.3 g/dL (ref 6.0–8.5)

## 2019-12-23 LAB — CBC WITH DIFFERENTIAL/PLATELET
Basophils Absolute: 0 10*3/uL (ref 0.0–0.2)
Basos: 1 %
EOS (ABSOLUTE): 0.2 10*3/uL (ref 0.0–0.4)
Eos: 2 %
Hematocrit: 43.3 % (ref 37.5–51.0)
Hemoglobin: 15.2 g/dL (ref 13.0–17.7)
Immature Grans (Abs): 0 10*3/uL (ref 0.0–0.1)
Immature Granulocytes: 1 %
Lymphocytes Absolute: 1.8 10*3/uL (ref 0.7–3.1)
Lymphs: 21 %
MCH: 32 pg (ref 26.6–33.0)
MCHC: 35.1 g/dL (ref 31.5–35.7)
MCV: 91 fL (ref 79–97)
Monocytes Absolute: 0.6 10*3/uL (ref 0.1–0.9)
Monocytes: 8 %
Neutrophils Absolute: 5.6 10*3/uL (ref 1.4–7.0)
Neutrophils: 67 %
Platelets: 228 10*3/uL (ref 150–450)
RBC: 4.75 x10E6/uL (ref 4.14–5.80)
RDW: 12.7 % (ref 11.6–15.4)
WBC: 8.3 10*3/uL (ref 3.4–10.8)

## 2019-12-23 LAB — LIPID PANEL W/O CHOL/HDL RATIO
Cholesterol, Total: 150 mg/dL (ref 100–199)
HDL: 33 mg/dL — ABNORMAL LOW (ref >39)
LDL Chol Calc (NIH): 63 mg/dL (ref 0–99)
Triglycerides: 345 mg/dL — ABNORMAL HIGH (ref 0–149)
VLDL Cholesterol Cal: 54 mg/dL — ABNORMAL HIGH (ref 5–40)

## 2019-12-23 LAB — URINALYSIS, MICROSCOPIC ONLY
Bacteria, UA: NONE SEEN
Casts: NONE SEEN /lpf
Epithelial Cells (non renal): NONE SEEN /hpf (ref 0–10)
WBC, UA: NONE SEEN /hpf (ref 0–5)

## 2019-12-23 LAB — APTT: aPTT: 28 s (ref 24–33)

## 2019-12-23 LAB — TSH: TSH: 0.869 u[IU]/mL (ref 0.450–4.500)

## 2019-12-24 LAB — URINE CULTURE: Organism ID, Bacteria: NO GROWTH

## 2019-12-25 NOTE — Progress Notes (Signed)
Please fax labs as  requested by paper form from his surgeon- patient brought to office at last visit and are to be faxed to [phone] Dr. Kathryne Gin medical.  Please inform patient of results and follow up labs.   Urine culture negative.  CBC within normal limits without anemia or signs of infection. TSH within normal limits.  CMP within normal standard range allowance.  APTT, INR clotting factors within normal limits.   LDL ( bad cholesterol) elevated and triglycerides very elevated.  Discuss lifestyle modification with patient e.g. increase exercise, fiber, fruits, vegetables, lean meat, and omega 3/fish intake and decrease saturated fat.  If patient following strict diet and exercise program already please schedule follow up appointment with primary care physician. HDL good cholesterol is low.  Recheck lipid panel/ CMP  in 3 months.

## 2020-01-11 ENCOUNTER — Ambulatory Visit: Payer: Federal, State, Local not specified - PPO | Attending: Internal Medicine

## 2020-01-11 DIAGNOSIS — Z20822 Contact with and (suspected) exposure to covid-19: Secondary | ICD-10-CM

## 2020-01-12 LAB — SARS-COV-2, NAA 2 DAY TAT

## 2020-01-12 LAB — NOVEL CORONAVIRUS, NAA: SARS-CoV-2, NAA: NOT DETECTED

## 2020-03-22 ENCOUNTER — Other Ambulatory Visit
Admission: RE | Admit: 2020-03-22 | Discharge: 2020-03-22 | Disposition: A | Discharge status: Home / Self Care | Payer: Federal, State, Local not specified - PPO | Source: Home / Self Care | Attending: Adult Health | Admitting: Adult Health

## 2020-03-22 ENCOUNTER — Ambulatory Visit: Payer: Federal, State, Local not specified - PPO | Admitting: Adult Health

## 2020-03-22 ENCOUNTER — Ambulatory Visit
Admission: RE | Admit: 2020-03-22 | Discharge: 2020-03-22 | Disposition: A | Discharge status: Home / Self Care | Payer: Federal, State, Local not specified - PPO | Source: Ambulatory Visit | Attending: Adult Health | Admitting: Adult Health

## 2020-03-22 ENCOUNTER — Other Ambulatory Visit: Payer: Self-pay

## 2020-03-22 ENCOUNTER — Encounter: Payer: Self-pay | Admitting: Adult Health

## 2020-03-22 VITALS — BP 112/78 | HR 77 | Temp 96.8°F | Resp 16 | Wt 252.8 lb

## 2020-03-22 DIAGNOSIS — R140 Abdominal distension (gaseous): Secondary | ICD-10-CM | POA: Insufficient documentation

## 2020-03-22 DIAGNOSIS — C189 Malignant neoplasm of colon, unspecified: Secondary | ICD-10-CM | POA: Insufficient documentation

## 2020-03-22 DIAGNOSIS — Z85048 Personal history of other malignant neoplasm of rectum, rectosigmoid junction, and anus: Secondary | ICD-10-CM | POA: Diagnosis not present

## 2020-03-22 DIAGNOSIS — I700 Atherosclerosis of aorta: Secondary | ICD-10-CM | POA: Diagnosis not present

## 2020-03-22 DIAGNOSIS — R635 Abnormal weight gain: Secondary | ICD-10-CM

## 2020-03-22 DIAGNOSIS — Z7689 Persons encountering health services in other specified circumstances: Secondary | ICD-10-CM | POA: Insufficient documentation

## 2020-03-22 DIAGNOSIS — K4020 Bilateral inguinal hernia, without obstruction or gangrene, not specified as recurrent: Secondary | ICD-10-CM | POA: Diagnosis not present

## 2020-03-22 DIAGNOSIS — R188 Other ascites: Secondary | ICD-10-CM | POA: Diagnosis not present

## 2020-03-22 DIAGNOSIS — R601 Generalized edema: Secondary | ICD-10-CM | POA: Insufficient documentation

## 2020-03-22 DIAGNOSIS — I708 Atherosclerosis of other arteries: Secondary | ICD-10-CM | POA: Diagnosis not present

## 2020-03-22 DIAGNOSIS — M5136 Other intervertebral disc degeneration, lumbar region: Secondary | ICD-10-CM | POA: Diagnosis not present

## 2020-03-22 DIAGNOSIS — I509 Heart failure, unspecified: Secondary | ICD-10-CM | POA: Diagnosis not present

## 2020-03-22 DIAGNOSIS — Z933 Colostomy status: Secondary | ICD-10-CM | POA: Insufficient documentation

## 2020-03-22 HISTORY — DX: Malignant neoplasm of colon, unspecified: C18.9

## 2020-03-22 LAB — AMYLASE: Amylase: 36 U/L (ref 28–100)

## 2020-03-22 LAB — LIPASE, BLOOD: Lipase: 33 U/L (ref 11–51)

## 2020-03-22 LAB — CBC WITH DIFFERENTIAL/PLATELET
Abs Immature Granulocytes: 0.03 10*3/uL (ref 0.00–0.07)
Basophils Absolute: 0 10*3/uL (ref 0.0–0.1)
Basophils Relative: 1 %
Eosinophils Absolute: 0.2 10*3/uL (ref 0.0–0.5)
Eosinophils Relative: 3 %
HCT: 38.2 % — ABNORMAL LOW (ref 39.0–52.0)
Hemoglobin: 13.4 g/dL (ref 13.0–17.0)
Immature Granulocytes: 0 %
Lymphocytes Relative: 31 %
Lymphs Abs: 2.1 10*3/uL (ref 0.7–4.0)
MCH: 31.1 pg (ref 26.0–34.0)
MCHC: 35.1 g/dL (ref 30.0–36.0)
MCV: 88.6 fL (ref 80.0–100.0)
Monocytes Absolute: 0.5 10*3/uL (ref 0.1–1.0)
Monocytes Relative: 7 %
Neutro Abs: 3.9 10*3/uL (ref 1.7–7.7)
Neutrophils Relative %: 58 %
Platelets: 234 10*3/uL (ref 150–400)
RBC: 4.31 MIL/uL (ref 4.22–5.81)
RDW: 13.7 % (ref 11.5–15.5)
WBC: 6.7 10*3/uL (ref 4.0–10.5)
nRBC: 0 % (ref 0.0–0.2)

## 2020-03-22 LAB — COMPREHENSIVE METABOLIC PANEL
ALT: 72 U/L — ABNORMAL HIGH (ref 0–44)
AST: 45 U/L — ABNORMAL HIGH (ref 15–41)
Albumin: 3.7 g/dL (ref 3.5–5.0)
Alkaline Phosphatase: 61 U/L (ref 38–126)
Anion gap: 7 (ref 5–15)
BUN: 13 mg/dL (ref 6–20)
CO2: 25 mmol/L (ref 22–32)
Calcium: 8.7 mg/dL — ABNORMAL LOW (ref 8.9–10.3)
Chloride: 104 mmol/L (ref 98–111)
Creatinine, Ser: 1.12 mg/dL (ref 0.61–1.24)
GFR calc Af Amer: >60 mL/min (ref >60)
GFR calc non Af Amer: >60 mL/min (ref >60)
Glucose, Bld: 116 mg/dL — ABNORMAL HIGH (ref 70–99)
Potassium: 3.8 mmol/L (ref 3.5–5.1)
Sodium: 136 mmol/L (ref 135–145)
Total Bilirubin: 0.7 mg/dL (ref 0.3–1.2)
Total Protein: 7.1 g/dL (ref 6.5–8.1)

## 2020-03-22 LAB — BRAIN NATRIURETIC PEPTIDE: B Natriuretic Peptide: 50 pg/mL (ref 0.0–100.0)

## 2020-03-22 MED ORDER — FUROSEMIDE 20 MG PO TABS: 10.0000 mg | ORAL_TABLET | Freq: Every day | ORAL | 0 refills | Status: DC

## 2020-03-22 MED ORDER — IOHEXOL 300 MG/ML  SOLN
100.0000 mL | Freq: Once | INTRAMUSCULAR | Status: AC | PRN
  Administered 2020-03-22: 100 mL via INTRAVENOUS

## 2020-03-22 NOTE — Progress Notes (Signed)
Patient wants results faxed and office note  to his surgeon at [facility] at fax numbers [phone] And [phone]  CT scans chest and abdomen and , labs  He declined surgical consult or cancer center follow up here in [place] at this time. He is aware to go to the nearest emergency room over the weekend should symptoms worsen at any time or any signs of infection.  Attention DR. Rob.   Discussed CT findings with patient ,recommend surgical consult, he declines and wants records faxed to his surgeon. He said he will call them. Declined cancer center follow up referral as well. CBC back now other labs pending. CBC ok. Risks versus benefits discussed. Patient verbalized understanding of all instructions given and denies any further questions at this time. He will keep follow up next week.

## 2020-03-22 NOTE — Patient Instructions (Signed)
Edema  Edema is when you have too much fluid in your body or under your skin. Edema may make your legs, feet, and ankles swell up. Swelling is also common in looser tissues, like around your eyes. This is a common condition. It gets more common as you get older. There are many possible causes of edema. Eating too much salt (sodium) and being on your feet or sitting for a long time can cause edema in your legs, feet, and ankles. Hot weather may make edema worse. Edema is usually painless. Your skin may look swollen or shiny. Follow these instructions at home:  Keep the swollen body part raised (elevated) above the level of your heart when you are sitting or lying down.  Do not sit still or stand for a long time.  Do not wear tight clothes. Do not wear garters on your upper legs.  Exercise your legs. This can help the swelling go down.  Wear elastic bandages or support stockings as told by your doctor.  Eat a low-salt (low-sodium) diet to reduce fluid as told by your doctor.  Depending on the cause of your swelling, you may need to limit how much fluid you drink (fluid restriction).  Take over-the-counter and prescription medicines only as told by your doctor. Contact a doctor if:  Treatment is not working.  You have heart, liver, or kidney disease and have symptoms of edema.  You have sudden and unexplained weight gain. Get help right away if:  You have shortness of breath or chest pain.  You cannot breathe when you lie down.  You have pain, redness, or warmth in the swollen areas.  You have heart, liver, or kidney disease and get edema all of a sudden.  You have a fever and your symptoms get worse all of a sudden. Summary  Edema is when you have too much fluid in your body or under your skin.  Edema may make your legs, feet, and ankles swell up. Swelling is also common in looser tissues, like around your eyes.  Raise (elevate) the swollen body part above the level of your  heart when you are sitting or lying down.  Follow your doctor's instructions about diet and how much fluid you can drink (fluid restriction). This information is not intended to replace advice given to you by your health care provider. Make sure you discuss any questions you have with your health care provider. Document Revised: 09/03/2017 Document Reviewed: 09/18/2016 Elsevier Patient Education  2020 Elsevier Inc.  

## 2020-03-22 NOTE — Progress Notes (Signed)
Chest X Ray within normal limits.

## 2020-03-22 NOTE — Progress Notes (Signed)
Established patient visit   Patient: Sean Bates   DOB: 1970-10-03   49 y.o. Male  MRN: [id number] Visit Date: 03/22/2020  Today's healthcare provider: Marcille Buffy, FNP   Chief Complaint  Patient presents with   Edema   Subjective    HPI  Patient comes in office today with new  concerns of retaining extra fluid throughout his body. Patient states that the has abdominal surgery on 01/22/20 and had a colostomy  bag placed May 3rd 2020 he has been back home 2 weeks at [facility]. He reports his dad lives there so he had surgery there. He denies ever having these issues in past.  Reviewed post op follow up note at [facility] but unable to see CT scan patient reports he had done.    Patient states that swelling has been sudden and gradually getting worse since yesterday. Patient denies nausea, vomiting, he states that abdomen is only slightly tender but nothing new since surgery.    He was having a lot of drainage odorous at surgery site/ rectum. He did two rounds of Augmentin last finished on 7/4/20021. Denied any reaction to the medication at time of taking.   Drainage of normal appearing stool in colostomy bag.   He went back to work Monday. He is on his feet all day on and off.   Diagnosed colorectal cancer 2006 treatments at cancer center [facility].  He has not gone to [facility]. He had chemo in past.  Last chemo treatment - 2007 for colorectal cancer. Last appointment at cancer center was canceled by patient 06/05/2019. Last visit I see at cancer center was 12/14/2014. He did see Talmage Nap at [facility] and was referred backl to oncology but appears did not go.   He denies shortness of breath, reports he does just feel tight all over in lower legs, abdomen, lower face and back.  Denies any recent illness, exposure or trauma. Denies any other allergic exposure or signs of anaphylaxis.  Patient  denies any fever, body aches,chills, rash,  chest pain, shortness of breath, nausea, vomiting, or diarrhea.  Denies dizziness, lightheadedness, pre syncopal or syncopal episodes.      Medications: Outpatient Medications Prior to Visit  Medication Sig   aspirin 325 MG tablet Take by mouth.   Calcium Carbonate-Vitamin D 600-400 MG-UNIT tablet Take by mouth.   Multiple Vitamin (MULTIVITAMIN) capsule Take 1 capsule by mouth daily.   [DISCONTINUED] loperamide (IMODIUM) 1 MG/5ML solution Take by mouth.   No facility-administered medications prior to visit.    Review of Systems  Constitutional: Positive for unexpected weight change. Negative for activity change, appetite change, chills, diaphoresis, fatigue and fever.  HENT: Positive for rhinorrhea (not new ).   Respiratory: Negative.   Cardiovascular: Positive for leg swelling. Negative for chest pain and palpitations.  Gastrointestinal: Positive for abdominal distention. Negative for abdominal pain, anal bleeding, blood in stool, constipation, diarrhea, nausea, rectal pain and vomiting.  Genitourinary: Negative.   Musculoskeletal: Negative.   Neurological: Negative.   Hematological: Negative.   Psychiatric/Behavioral: Negative.     Last CBC Lab Results  Component Value Date   WBC 8.3 12/22/2019   HGB 15.2 12/22/2019   HCT 43.3 12/22/2019   MCV 91 12/22/2019   MCH 32.0 12/22/2019   RDW 12.7 12/22/2019   PLT 228 58/85/0277   Last metabolic panel Lab Results  Component Value Date   GLUCOSE 93 12/22/2019   NA 140 12/22/2019   K  4.3 12/22/2019   CL 107 (H) 12/22/2019   CO2 20 12/22/2019   BUN 14 12/22/2019   CREATININE 0.93 12/22/2019   GFRNONAA 96 12/22/2019   GFRAA 111 12/22/2019   CALCIUM 9.0 12/22/2019   PROT 6.3 12/22/2019   ALBUMIN 3.8 (L) 12/22/2019   LABGLOB 2.5 12/22/2019   AGRATIO 1.5 12/22/2019   BILITOT <0.2 12/22/2019   ALKPHOS 70 12/22/2019   AST 20 12/22/2019   ALT 18 12/22/2019   ANIONGAP 4 (L) 10/08/2014   Last lipids Lab Results    Component Value Date   CHOL 150 12/22/2019   HDL 33 (L) 12/22/2019   LDLCALC 63 12/22/2019   TRIG 345 (H) 12/22/2019   CHOLHDL 3.7 03/26/2016   Last hemoglobin A1c No results found for: HGBA1C Last thyroid functions Lab Results  Component Value Date   TSH 0.869 12/22/2019   Last vitamin D No results found for: 25OHVITD2, 25OHVITD3, VD25OH Last vitamin B12 and Folate No results found for: VITAMINB12, FOLATE    Objective    BP 112/78    Pulse 77    Temp (!) 96.8 F (36 C) (Oral)    Resp 16    Wt 252 lb 12.8 oz (114.7 kg)    SpO2 96%    BMI 29.98 kg/m  BP Readings from Last 3 Encounters:  03/22/20 112/78  12/22/19 110/66  12/16/18 121/71   Wt Readings from Last 3 Encounters:  03/22/20 252 lb 12.8 oz (114.7 kg)  12/22/19 222 lb 3.2 oz (100.8 kg)  12/16/18 219 lb 12.8 oz (99.7 kg)    weight increase from April to July 222 lbs to 252 lbs.  Physical Exam Vitals and nursing note reviewed.  Constitutional:      Appearance: Normal appearance. He is ill-appearing.     Comments: Patient is alert and oriented and responsive to questions Engages in eye contact with provider. Speaks in full sentences without any pauses without any shortness of breath or distress.    HENT:     Head: Normocephalic and atraumatic.     Nose: Nose normal. No congestion or rhinorrhea.     Mouth/Throat:     Mouth: Mucous membranes are moist.     Pharynx: No oropharyngeal exudate or posterior oropharyngeal erythema.  Eyes:     Conjunctiva/sclera: Conjunctivae normal.     Pupils: Pupils are equal, round, and reactive to light.  Cardiovascular:     Rate and Rhythm: Normal rate and regular rhythm.     Pulses: Normal pulses.     Heart sounds: Normal heart sounds.  Pulmonary:     Effort: Pulmonary effort is normal. No respiratory distress.     Breath sounds: Normal breath sounds. No stridor. No wheezing, rhonchi or rales.  Chest:     Chest wall: No tenderness.  Abdominal:     General: There is  distension.     Palpations: Abdomen is soft.     Tenderness: There is no abdominal tenderness (he denies , notice facial grimace with palpation of lower abdomen. no guarding. ). There is no guarding or rebound.     Comments: Left lower quadrant colostomy bag with brown stool present, distension of left lower side more than right noted when standing. Intact, sealed and no drainage or erythema at site. No tenderness.   Musculoskeletal:        General: Swelling present.     Cervical back: Normal range of motion and neck supple.     Right lower leg: Edema present.  Left lower leg: Edema present.     Comments: Mild swelling noted in face, bilateral hands. Prominenet abdomen and bilateral lower extremity edema.   No erythema or rash.   Lymphadenopathy:     Cervical: No cervical adenopathy.  Skin:    General: Skin is warm and dry.     Capillary Refill: Capillary refill takes less than 2 seconds.  Neurological:     General: No focal deficit present.     Mental Status: He is alert and oriented to person, place, and time.  Psychiatric:        Mood and Affect: Mood normal.        Behavior: Behavior normal.        Thought Content: Thought content normal.        Judgment: Judgment normal.      No results found for any visits on 03/22/20.  Assessment & Plan     The primary encounter diagnosis was Malignant neoplasm of colon, unspecified part of colon ([place]). Diagnoses of H/O malignant neoplasm of rectum, Encounter for assessment of ascites, Other ascites, Weight gain, Generalized edema, and Colostomy present [facility]) were also pertinent to this visit.   Orders Placed This Encounter  Procedures   DG Chest 2 View   CT Abdomen Pelvis W Contrast   CT Chest Wo Contrast   CT Chest W Contrast   CBC with Differential/Platelet   Comprehensive Metabolic Panel (CMET)   B Nat Peptide   Lipase   Amylase   Concerning for metastasis from previous colorectal cancer, no recent follow up with  oncology. Recent colostomy placed at [facility]. Had post of follow up and two rounds of Aumentin completed over one week ago he reports.    Meds ordered this encounter  Medications   furosemide (LASIX) 20 MG tablet    Sig: Take 0.5 tablets (10 mg total) by mouth daily.    Dispense:  15 tablet    Refill:  0  will give to help with fluid retention.    Denies any infectious symptoms currently, no shortness of breath reported.   Going for labs and imaging now. Advised needs follow up with cancer center.   Provider thoroughly discussed in collaboration above plan with supervising physician Dr. Miguel Aschoff who is in agreement with the care plan as above.   Return in about 1 week (around 03/29/2020), or if symptoms worsen or fail to improve, for at any time for any worsening symptoms, Go to Emergency room/ urgent care if worse.      The entirety of the information documented in the History of Present Illness, Review of Systems and Physical Exam were personally obtained by me. Portions of this information were initially documented by the CMA and reviewed by me for thoroughness and accuracy.   [place] Tajay Muzzy, FNP    Marcille Buffy, [place] [phone] (phone) [phone] (fax)  [place]

## 2020-03-29 ENCOUNTER — Ambulatory Visit: Payer: Federal, State, Local not specified - PPO | Admitting: Adult Health

## 2020-03-29 ENCOUNTER — Encounter: Payer: Self-pay | Admitting: Adult Health

## 2020-03-29 ENCOUNTER — Other Ambulatory Visit: Payer: Self-pay

## 2020-03-29 VITALS — BP 122/80 | HR 76 | Temp 96.2°F | Resp 15 | Wt 251.4 lb

## 2020-03-29 DIAGNOSIS — R635 Abnormal weight gain: Secondary | ICD-10-CM

## 2020-03-29 DIAGNOSIS — C189 Malignant neoplasm of colon, unspecified: Secondary | ICD-10-CM

## 2020-03-29 DIAGNOSIS — R188 Other ascites: Secondary | ICD-10-CM

## 2020-03-29 DIAGNOSIS — Z720 Tobacco use: Secondary | ICD-10-CM

## 2020-03-29 DIAGNOSIS — Z933 Colostomy status: Secondary | ICD-10-CM | POA: Diagnosis not present

## 2020-03-29 DIAGNOSIS — E278 Other specified disorders of adrenal gland: Secondary | ICD-10-CM | POA: Diagnosis not present

## 2020-03-29 MED ORDER — FUROSEMIDE 20 MG PO TABS: 20.0000 mg | ORAL_TABLET | Freq: Every day | ORAL | 0 refills | Status: DC

## 2020-03-29 NOTE — Patient Instructions (Signed)
Call your surgeon ASAP.  I recommend seeing gastroenterologist and follow up at cancer center ASAP.    Edema  Edema is when you have too much fluid in your body or under your skin. Edema may make your legs, feet, and ankles swell up. Swelling is also common in looser tissues, like around your eyes. This is a common condition. It gets more common as you get older. There are many possible causes of edema. Eating too much salt (sodium) and being on your feet or sitting for a long time can cause edema in your legs, feet, and ankles. Hot weather may make edema worse. Edema is usually painless. Your skin may look swollen or shiny. Follow these instructions at home:  Keep the swollen body part raised (elevated) above the level of your heart when you are sitting or lying down.  Do not sit still or stand for a long time.  Do not wear tight clothes. Do not wear garters on your upper legs.  Exercise your legs. This can help the swelling go down.  Wear elastic bandages or support stockings as told by your doctor.  Eat a low-salt (low-sodium) diet to reduce fluid as told by your doctor.  Depending on the cause of your swelling, you may need to limit how much fluid you drink (fluid restriction).  Take over-the-counter and prescription medicines only as told by your doctor. Contact a doctor if:  Treatment is not working.  You have heart, liver, or kidney disease and have symptoms of edema.  You have sudden and unexplained weight gain. Get help right away if:  You have shortness of breath or chest pain.  You cannot breathe when you lie down.  You have pain, redness, or warmth in the swollen areas.  You have heart, liver, or kidney disease and get edema all of a sudden.  You have a fever and your symptoms get worse all of a sudden. Summary  Edema is when you have too much fluid in your body or under your skin.  Edema may make your legs, feet, and ankles swell up. Swelling is also common  in looser tissues, like around your eyes.  Raise (elevate) the swollen body part above the level of your heart when you are sitting or lying down.  Follow your doctor's instructions about diet and how much fluid you can drink (fluid restriction). This information is not intended to replace advice given to you by your health care provider. Make sure you discuss any questions you have with your health care provider. Document Revised: 09/03/2017 Document Reviewed: 09/18/2016 Elsevier Patient Education  [place]. Abdominal Bloating When you have abdominal bloating, your abdomen may feel full, tight, or painful. It may also look bigger than normal or swollen (distended). Common causes of abdominal bloating include:  Swallowing air.  Constipation.  Problems digesting food.  Eating too much.  Irritable bowel syndrome. This is a condition that affects the large intestine.  Lactose intolerance. This is an inability to digest lactose, a natural sugar in dairy products.  Celiac disease. This is a condition that affects the ability to digest gluten, a protein found in some grains.  Gastroparesis. This is a condition that slows down the movement of food in the stomach and small intestine. It is more common in people with diabetes mellitus.  Gastroesophageal reflux disease (GERD). This is a digestive condition that makes stomach acid flow back into the esophagus.  Urinary retention. This means that the body is holding onto  urine, and the bladder cannot be emptied all the way. Follow these instructions at home: Eating and drinking  Avoid eating too much.  Try not to swallow air while talking or eating.  Avoid eating while lying down.  Avoid these foods and drinks: ? Foods that cause gas, such as broccoli, cabbage, cauliflower, and baked beans. ? Carbonated drinks. ? Hard candy. ? Chewing gum. Medicines  Take over-the-counter and prescription medicines only as told by your  health care provider.  Take probiotic medicines. These medicines contain live bacteria or yeasts that can help digestion.  Take coated peppermint oil capsules. Activity  Try to exercise regularly. Exercise may help to relieve bloating that is caused by gas and relieve constipation. General instructions  Keep all follow-up visits as told by your health care provider. This is important. Contact a health care provider if:  You have nausea and vomiting.  You have diarrhea.  You have abdominal pain.  You have unusual weight loss or weight gain.  You have severe pain, and medicines do not help. Get help right away if:  You have severe chest pain.  You have trouble breathing.  You have shortness of breath.  You have trouble urinating.  You have darker urine than normal.  You have blood in your stools or have dark, tarry stools. Summary  Abdominal bloating means that the abdomen is swollen.  Common causes of abdominal bloating are swallowing air, constipation, and problems digesting food.  Avoid eating too much and avoid swallowing air.  Avoid foods that cause gas, carbonated drinks, hard candy, and chewing gum. This information is not intended to replace advice given to you by your health care provider. Make sure you discuss any questions you have with your health care provider. Document Revised: 12/19/2018 Document Reviewed: 10/02/2016 Elsevier Patient Education  [place].

## 2020-03-29 NOTE — Progress Notes (Signed)
Established patient visit   Patient: Sean Bates   DOB: Jun 30, 1971   49 y.o. Male  MRN: [id number] Visit Date: 03/29/2020  Today's healthcare provider: Marcille Buffy, FNP   Chief Complaint  Patient presents with  . Leg Swelling   Subjective    HPI  Follow up for edema  Patient was seen 03/22/2020 for edema and ascites with abdominal swelling. He was advised that he would go for CT scan those results are resulted in epic and discussed with patient. Recommendation was for patient to follow-up with the oncology center at [facility] given his malignant neoplasm of colon in the past. Also recommendation for referral to surgeon. Patient declined both he will need to contact his surgeon who completed his colostomy bag recently. [place] Patient reports he has not heard back from his surgeon at this time.  Denies any worsening in his symptoms and possibly only slight improvement with the Lasix The patient was last seen for this 1 weeks ago. Changes made at last visit include started patient on Lasix  that was  started since last visit 10mg .  He reports excellent compliance with treatment. He feels that condition is Unchanged. He is not having side effects.   He denies any pain but does report that he still feels tight all over. He denies any shortness of breath,  CT chest and CT of the abdomen was done on 03/22/2020.  Patient  denies any fever, body aches,chills, rash, chest pain, shortness of breath, nausea, vomiting, or diarrhea. CXR was negative 03/22/2020.  .CT abdomen IMPRESSION: Prior abdominal perineal resection with sigmoid colostomy in LEFT mid abdomen. Diffuse stranding in the presacral space and rectal bed consistent with prior resection. Ill-defined low-attenuation within the rectal bed 18 x 11 mm with a single focus of gas question postoperative collection or tiny abscess. Small BILATERAL inguinal hernias containing fat. No acute  intrathoracic abnormalities. Tiny LEFT adrenal nodule 10 x 8 mm, indeterminate attenuation and washout; in light of history of colorectal cancer, recommend characterization by adrenal CT. Aortic Atherosclerosis (ICD10-I70.0).  Electronically Signed  COMPARISON:  CT abdomen and pelvis 06/11/2005  FINDINGS: CT CHEST FINDINGS  Cardiovascular: Aorta normal caliber. Mild coronary arterial calcifications. Heart normal size. No pericardial effusion.  Mediastinum/Nodes: Base of cervical region normal appearance. Esophagus unremarkable. No thoracic adenopathy.  Lungs/Pleura: Lungs clear. No pulmonary infiltrate, pleural effusion, pneumothorax or mass.  Musculoskeletal: No acute osseous abnormalities. Incidentally noted subcutaneous cyst anterior abdominal wall RIGHT of midline 17 x 12 mm, favor sebaceous versus epidermal inclusion cyst.  -----------------------------------------------------------------------------------------  Patient Active Problem List   Diagnosis Date Noted  . Adrenal nodule ([place]) 04/03/2020  . Malignant neoplasm of colon ([place]) 03/22/2020  . Weight gain 03/22/2020  . Generalized edema 03/22/2020  . Other ascites 03/22/2020  . Encounter for assessment of ascites 03/22/2020  . Colostomy present ([place]) 03/22/2020  . H/O malignant neoplasm of colon 03/11/2015  . H/O malignant neoplasm of rectum 09/20/2014  . 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 Familial polyposis 09/20/2014  . Current tobacco use 11/16/2006   Past Medical History:  Diagnosis Date  . Cancer [facility])    history of colon   Social History   Tobacco Use  . Smoking status: Former Smoker    Packs/day: 1.00    Years: 22.00    Pack years: 22.00    Types: Cigarettes  . Smokeless tobacco: Former Systems developer    Quit date: 09/13/1986  Substance Use  Topics  . Alcohol use: Yes    Alcohol/week: 0.0 standard drinks    Comment: occasional  . Drug use: No   No Known Allergies      Medications: Outpatient Medications Prior to Visit  Medication Sig  . aspirin 325 MG tablet Take by mouth.  . Calcium Carbonate-Vitamin D 600-400 MG-UNIT tablet Take by mouth.  . Multiple Vitamin (MULTIVITAMIN) capsule Take 1 capsule by mouth daily.  . [DISCONTINUED] furosemide (LASIX) 20 MG tablet Take 0.5 tablets (10 mg total) by mouth daily.   No facility-administered medications prior to visit.    Review of Systems  Last CBC Lab Results  Component Value Date   WBC 6.7 03/22/2020   HGB 13.4 03/22/2020   HCT 38.2 (L) 03/22/2020   MCV 88.6 03/22/2020   MCH 31.1 03/22/2020   RDW 13.7 03/22/2020   PLT 234 54/00/8676   Last metabolic panel Lab Results  Component Value Date   GLUCOSE 116 (H) 03/22/2020   NA 136 03/22/2020   K 3.8 03/22/2020   CL 104 03/22/2020   CO2 25 03/22/2020   BUN 13 03/22/2020   CREATININE 1.12 03/22/2020   GFRNONAA >60 03/22/2020   GFRAA >60 03/22/2020   CALCIUM 8.7 (L) 03/22/2020   PROT 7.1 03/22/2020   ALBUMIN 3.7 03/22/2020   LABGLOB 2.5 12/22/2019   AGRATIO 1.5 12/22/2019   BILITOT 0.7 03/22/2020   ALKPHOS 61 03/22/2020   AST 45 (H) 03/22/2020   ALT 72 (H) 03/22/2020   ANIONGAP 7 03/22/2020   Last lipids Lab Results  Component Value Date   CHOL 150 12/22/2019   HDL 33 (L) 12/22/2019   LDLCALC 63 12/22/2019   TRIG 345 (H) 12/22/2019   CHOLHDL 3.7 03/26/2016   Last hemoglobin A1c No results found for: HGBA1C Last thyroid functions Lab Results  Component Value Date   TSH 0.869 12/22/2019   Last vitamin D No results found for: 25OHVITD2, 25OHVITD3, VD25OH Last vitamin B12 and Folate No results found for: VITAMINB12, FOLATE    Objective    BP 122/80   Pulse 76   Temp (!) 96.2 F (35.7 C) (Oral)   Resp 15   Wt 251 lb 6.4 oz (114 kg)   SpO2 96%   BMI 29.81 kg/m  BP Readings from Last 3 Encounters:  03/29/20 122/80  03/22/20 112/78  12/22/19 110/66      Physical Exam Constitutional:      General: He is not in  acute distress.    Appearance: He is not toxic-appearing or diaphoretic.  HENT:     Head: Normocephalic.     Nose: Nose normal.     Mouth/Throat:     Mouth: Mucous membranes are moist.  Eyes:     Conjunctiva/sclera: Conjunctivae normal.     Pupils: Pupils are equal, round, and reactive to light.  Cardiovascular:     Rate and Rhythm: Normal rate and regular rhythm.     Pulses: Normal pulses.     Heart sounds: Normal heart sounds. No murmur heard.  No friction rub. No gallop.   Pulmonary:     Effort: Pulmonary effort is normal. No respiratory distress.     Breath sounds: Normal breath sounds. No stridor. No wheezing, rhonchi or rales.  Chest:     Chest wall: No tenderness.  Abdominal:     General: There is distension (distended throughout ).     Tenderness: There is abdominal tenderness (mild he reports. ).     Comments: Colostomy bag is  full of normal appearing stool   Genitourinary:    Comments: Denies edema/ declines exam.  Musculoskeletal:        General: No deformity or signs of injury.     Cervical back: Normal range of motion and neck supple. No rigidity or tenderness.     Right lower leg: Edema present.     Left lower leg: Edema present.  Lymphadenopathy:     Cervical: No cervical adenopathy.  Skin:    General: Skin is warm.     Capillary Refill: Capillary refill takes less than 2 seconds.     Findings: No erythema or rash.  Neurological:     Mental Status: He is alert and oriented to person, place, and time.  Psychiatric:        Mood and Affect: Mood normal.        Thought Content: Thought content normal.        Judgment: Judgment normal.   Facial edema seems to have resolved.  No results found for any visits on 03/29/20.  Assessment & Plan     Malignant neoplasm of colon, unspecified part of colon ([place])  Adrenal nodule ([place])  Colostomy present ([place])  Current tobacco use  Other ascites  Weight gain  Discussed the importance of him following up with  his surgeon, and recommendation to return to the [facility] oncology center for further evaluation.  Needs further work-up and imaging on his adrenal gland per CT scan given history of malignancy of colon.  Patient does have a colostomy bag.  Patient has new onset of edema without any known trigger, and most recent recent history of having his colostomy with [place] Kelli Hope was a Education officer, environmental that he seen. Encounter Providers Encounter Date  D[place]hea [place] (Admitting, Attending) [phone] (Work) General Surgery [facility] [address] [address]- Unknown in [place] 2 Unknown in [place] 2 (Referring) [phone] (Work) NOT AVAILABLE IN Jun. 11, 2021June 11, 2021  Patient prefers to only follow-up with this provider, he says he has called the office last week and has not heard back, he will call again today and this office did fax and received a fax received record.  Will fax records again.  Colostomy putting in a referral for the cancer center here at [facility] or any other local cancer center, as well as any referral to a surgeon at this time.   He is also aware that he is overdue for an annual exam, and lab work for health maintenance he will schedule this at a later date he reports.  Red Flags discussed. The patient was given clear instructions to go to ER or return to medical center if any red flags develop, symptoms do not improve, worsen or new problems develop. They verbalized understanding.  Advised patient call the office or your primary care doctor for an appointment if no improvement within 72 hours or if any symptoms change or worsen at any time  Advised ER or urgent Care if after hours or on weekend. Call 911 for emergency symptoms at any time.Patinet verbalized understanding of all instructions given/reviewed and treatment plan and has no further questions or concerns at this time.      Return in about 1  week (around 04/05/2020), or if symptoms worsen or fail to improve, for at any time for any worsening symptoms, Go to Emergency room/ urgent care if worse.       I, Wellington Hampshire Copeland Neisen,  FNP, have reviewed all documentation for this visit. The documentation on 04/03/20 for the exam, diagnosis, procedures, and orders are all accurate and complete.    Marcille Buffy, [place] [phone] (phone) [phone] (fax)  [place]

## 2020-04-03 ENCOUNTER — Ambulatory Visit: Payer: Federal, State, Local not specified - PPO | Admitting: Adult Health

## 2020-04-03 ENCOUNTER — Other Ambulatory Visit
Admission: RE | Admit: 2020-04-03 | Discharge: 2020-04-03 | Disposition: A | Discharge status: Home / Self Care | Payer: Federal, State, Local not specified - PPO | Attending: Adult Health | Admitting: Adult Health

## 2020-04-03 ENCOUNTER — Other Ambulatory Visit: Payer: Self-pay

## 2020-04-03 ENCOUNTER — Encounter: Payer: Self-pay | Admitting: Adult Health

## 2020-04-03 VITALS — BP 126/76 | HR 75 | Temp 96.6°F | Resp 16 | Wt 255.0 lb

## 2020-04-03 DIAGNOSIS — R635 Abnormal weight gain: Secondary | ICD-10-CM

## 2020-04-03 DIAGNOSIS — C189 Malignant neoplasm of colon, unspecified: Secondary | ICD-10-CM | POA: Insufficient documentation

## 2020-04-03 DIAGNOSIS — E278 Other specified disorders of adrenal gland: Secondary | ICD-10-CM | POA: Insufficient documentation

## 2020-04-03 DIAGNOSIS — R188 Other ascites: Secondary | ICD-10-CM | POA: Insufficient documentation

## 2020-04-03 DIAGNOSIS — Z933 Colostomy status: Secondary | ICD-10-CM

## 2020-04-03 LAB — COMPREHENSIVE METABOLIC PANEL
ALT: 57 U/L — ABNORMAL HIGH (ref 0–44)
AST: 40 U/L (ref 15–41)
Albumin: 3.9 g/dL (ref 3.5–5.0)
Alkaline Phosphatase: 76 U/L (ref 38–126)
Anion gap: 10 (ref 5–15)
BUN: 17 mg/dL (ref 6–20)
CO2: 26 mmol/L (ref 22–32)
Calcium: 9.2 mg/dL (ref 8.9–10.3)
Chloride: 103 mmol/L (ref 98–111)
Creatinine, Ser: 1.34 mg/dL — ABNORMAL HIGH (ref 0.61–1.24)
GFR calc Af Amer: >60 mL/min (ref >60)
GFR calc non Af Amer: >60 mL/min (ref >60)
Glucose, Bld: 118 mg/dL — ABNORMAL HIGH (ref 70–99)
Potassium: 3.9 mmol/L (ref 3.5–5.1)
Sodium: 139 mmol/L (ref 135–145)
Total Bilirubin: 0.6 mg/dL (ref 0.3–1.2)
Total Protein: 7.3 g/dL (ref 6.5–8.1)

## 2020-04-03 LAB — CBC WITH DIFFERENTIAL/PLATELET
Abs Immature Granulocytes: 0.05 10*3/uL (ref 0.00–0.07)
Basophils Absolute: 0 10*3/uL (ref 0.0–0.1)
Basophils Relative: 1 %
Eosinophils Absolute: 0.2 10*3/uL (ref 0.0–0.5)
Eosinophils Relative: 3 %
HCT: 39.1 % (ref 39.0–52.0)
Hemoglobin: 13.2 g/dL (ref 13.0–17.0)
Immature Granulocytes: 1 %
Lymphocytes Relative: 28 %
Lymphs Abs: 2.1 10*3/uL (ref 0.7–4.0)
MCH: 30.2 pg (ref 26.0–34.0)
MCHC: 33.8 g/dL (ref 30.0–36.0)
MCV: 89.5 fL (ref 80.0–100.0)
Monocytes Absolute: 0.7 10*3/uL (ref 0.1–1.0)
Monocytes Relative: 9 %
Neutro Abs: 4.4 10*3/uL (ref 1.7–7.7)
Neutrophils Relative %: 58 %
Platelets: 237 10*3/uL (ref 150–400)
RBC: 4.37 MIL/uL (ref 4.22–5.81)
RDW: 13.3 % (ref 11.5–15.5)
WBC: 7.5 10*3/uL (ref 4.0–10.5)
nRBC: 0 % (ref 0.0–0.2)

## 2020-04-03 NOTE — Patient Instructions (Signed)
Edema  Edema is when you have too much fluid in your body or under your skin. Edema may make your legs, feet, and ankles swell up. Swelling is also common in looser tissues, like around your eyes. This is a common condition. It gets more common as you get older. There are many possible causes of edema. Eating too much salt (sodium) and being on your feet or sitting for a long time can cause edema in your legs, feet, and ankles. Hot weather may make edema worse. Edema is usually painless. Your skin may look swollen or shiny. Follow these instructions at home:  Keep the swollen body part raised (elevated) above the level of your heart when you are sitting or lying down.  Do not sit still or stand for a long time.  Do not wear tight clothes. Do not wear garters on your upper legs.  Exercise your legs. This can help the swelling go down.  Wear elastic bandages or support stockings as told by your doctor.  Eat a low-salt (low-sodium) diet to reduce fluid as told by your doctor.  Depending on the cause of your swelling, you may need to limit how much fluid you drink (fluid restriction).  Take over-the-counter and prescription medicines only as told by your doctor. Contact a doctor if:  Treatment is not working.  You have heart, liver, or kidney disease and have symptoms of edema.  You have sudden and unexplained weight gain. Get help right away if:  You have shortness of breath or chest pain.  You cannot breathe when you lie down.  You have pain, redness, or warmth in the swollen areas.  You have heart, liver, or kidney disease and get edema all of a sudden.  You have a fever and your symptoms get worse all of a sudden. Summary  Edema is when you have too much fluid in your body or under your skin.  Edema may make your legs, feet, and ankles swell up. Swelling is also common in looser tissues, like around your eyes.  Raise (elevate) the swollen body part above the level of your  heart when you are sitting or lying down.  Follow your doctor's instructions about diet and how much fluid you can drink (fluid restriction). This information is not intended to replace advice given to you by your health care provider. Make sure you discuss any questions you have with your health care provider. Document Revised: 09/03/2017 Document Reviewed: 09/18/2016 Elsevier Patient Education  2020 Elsevier Inc.  

## 2020-04-03 NOTE — Progress Notes (Signed)
Acute Office Visit  Subjective:    Patient ID: Sean Bates, male    DOB: 1971/03/16, 49 y.o.   MRN: [id number]  Chief Complaint  Patient presents with  . Edema    HPI Patient is in today for follow-up for his edema generalized, he also has abdominal distention.  He reports that his symptoms are unchanged not worsening.  He has declined referral to surgery here in [place] as well as a referral to gastroenterology as well as to the cancer center.  Denies any signs of infection. He denies any shortness of breath dyspnea or orthopnea.  Patient had a negative chest x-ray and CT scan of the chest. CT abdomen and pelvis, x-ray of the chest and CT of the chest was performed at initial visit on 03/22/2020.  Patient is overdue for primary care and needs to schedule that as well and is aware.  He did have an incidental finding of an adrenal nodule on the CT scan and further imaging was recommended as well as questionable abscess versus fluid, he has been treated for an abscess twice by his surgeon.  Denies any pain.  He prefers to hold off at this time until he talks to his physician in [place].  Patient says he call their office on Monday morning and has not heard back, he will call them again today.  He again declines referrals at this time.  Patient  denies any fever, body aches,chills, rash, chest pain, shortness of breath, nausea, vomiting, or diarrhea.  Denies dizziness, lightheadedness, pre syncopal or syncopal episodes.    Past Medical History:  Diagnosis Date  . Cancer [facility])    history of colon    Past Surgical History:  Procedure Laterality Date  . COLON SURGERY  2006    Family History  Problem Relation Age of Onset  . Cancer Paternal Aunt   . Asthma Maternal Grandmother   . Cancer Paternal Grandmother   . Healthy Mother   . Diabetes Father     Social History   Socioeconomic History  . Marital status: Single    Spouse name: Not on file  . Number of children: 1    . Years of education: 14  . Highest education level: Not on file  Occupational History    Employer: [employer]  Tobacco Use  . Smoking status: Former Smoker    Packs/day: 1.00    Years: 22.00    Pack years: 22.00    Types: Cigarettes  . Smokeless tobacco: Former Systems developer    Quit date: 09/13/1986  Substance and Sexual Activity  . Alcohol use: Yes    Alcohol/week: 0.0 standard drinks    Comment: occasional  . Drug use: No  . Sexual activity: Not on file  Other Topics Concern  . Not on file  Social History Narrative  . Not on file   Social Determinants of Health   Financial Resource Strain:   . Difficulty of Paying Living Expenses:   Food Insecurity:   . Worried About Charity fundraiser in the Last Year:   . Arboriculturist in the Last Year:   Transportation Needs:   . Film/video editor (Medical):   Marland Kitchen Lack of Transportation (Non-Medical):   Physical Activity:   . Days of Exercise per Week:   . Minutes of Exercise per Session:   Stress:   . Feeling of Stress :   Social Connections:   . Frequency of Communication with Friends and Family:   .  Frequency of Social Gatherings with Friends and Family:   . Attends Religious Services:   . Active Member of Clubs or Organizations:   . Attends Archivist Meetings:   Marland Kitchen Marital Status:   Intimate Partner Violence:   . Fear of Current or Ex-Partner:   . Emotionally Abused:   Marland Kitchen Physically Abused:   . Sexually Abused:     Outpatient Medications Prior to Visit  Medication Sig Dispense Refill  . aspirin 325 MG tablet Take by mouth.    . Calcium Carbonate-Vitamin D 600-400 MG-UNIT tablet Take by mouth.    . furosemide (LASIX) 20 MG tablet Take 1 tablet (20 mg total) by mouth daily. 15 tablet 0  . Multiple Vitamin (MULTIVITAMIN) capsule Take 1 capsule by mouth daily.     No facility-administered medications prior to visit.    No Known Allergies  Review of Systems  Constitutional: Negative.   HENT: Negative.    Respiratory: Negative.   Cardiovascular: Negative.   Gastrointestinal: Positive for abdominal distention. Negative for abdominal pain, anal bleeding, blood in stool, constipation, diarrhea, nausea, rectal pain and vomiting.  Genitourinary: Negative for difficulty urinating.       Has colostomy bag with brown stool   Neurological: Negative.   Psychiatric/Behavioral: Negative.        Objective:    Physical Exam Nursing note reviewed: no change in physical exam from last week.   Constitutional:      General: He is not in acute distress.    Appearance: He is not toxic-appearing or diaphoretic.  HENT:     Head: Normocephalic.     Nose: Nose normal.     Mouth/Throat:     Mouth: Mucous membranes are moist.  Eyes:     Conjunctiva/sclera: Conjunctivae normal.     Pupils: Pupils are equal, round, and reactive to light.  Cardiovascular:     Rate and Rhythm: Normal rate and regular rhythm.     Pulses: Normal pulses.     Heart sounds: Normal heart sounds. No murmur heard.  No friction rub. No gallop.   Pulmonary:     Effort: Pulmonary effort is normal. No respiratory distress.     Breath sounds: Normal breath sounds. No stridor. No wheezing, rhonchi or rales.  Chest:     Chest wall: No tenderness.  Abdominal:     General: There is distension (distended throughout ).     Tenderness: There is abdominal tenderness (mild he reports. ).     Comments: Colostomy bag is full of normal appearing stool   Genitourinary:    Comments: Denies edema/ declines exam.  Musculoskeletal:        General: No deformity or signs of injury.     Cervical back: Normal range of motion and neck supple. No rigidity or tenderness.     Right lower leg: Edema present.     Left lower leg: Edema present.  Lymphadenopathy:     Cervical: No cervical adenopathy.  Skin:    General: Skin is warm.     Capillary Refill: Capillary refill takes less than 2 seconds.     Findings: No erythema or rash.  Neurological:      Mental Status: He is alert and oriented to person, place, and time.  Psychiatric:        Mood and Affect: Mood normal.        Thought Content: Thought content normal.        Judgment: Judgment normal.     BP  126/76   Pulse 75   Temp (!) 96.6 F (35.9 C) (Oral)   Resp 16   Wt 255 lb (115.7 kg)   SpO2 96%   BMI 30.24 kg/m  Wt Readings from Last 3 Encounters:  04/03/20 255 lb (115.7 kg)  03/29/20 251 lb 6.4 oz (114 kg)  03/22/20 252 lb 12.8 oz (114.7 kg)    Health Maintenance Due  Topic Date Due  . Hepatitis C Screening  Never done  . COVID-19 Vaccine (1) Never done  . HIV Screening  Never done  . TETANUS/TDAP  11/29/2017  . COLONOSCOPY  Never done    There are no preventive care reminders to display for this patient.   Lab Results  Component Value Date   TSH 0.869 12/22/2019   Lab Results  Component Value Date   WBC 6.7 03/22/2020   HGB 13.4 03/22/2020   HCT 38.2 (L) 03/22/2020   MCV 88.6 03/22/2020   PLT 234 03/22/2020   Lab Results  Component Value Date   NA 136 03/22/2020   K 3.8 03/22/2020   CO2 25 03/22/2020   GLUCOSE 116 (H) 03/22/2020   BUN 13 03/22/2020   CREATININE 1.12 03/22/2020   BILITOT 0.7 03/22/2020   ALKPHOS 61 03/22/2020   AST 45 (H) 03/22/2020   ALT 72 (H) 03/22/2020   PROT 7.1 03/22/2020   ALBUMIN 3.7 03/22/2020   CALCIUM 8.7 (L) 03/22/2020   ANIONGAP 7 03/22/2020   Lab Results  Component Value Date   CHOL 150 12/22/2019   Lab Results  Component Value Date   HDL 33 (L) 12/22/2019   Lab Results  Component Value Date   LDLCALC 63 12/22/2019   Lab Results  Component Value Date   TRIG 345 (H) 12/22/2019   Lab Results  Component Value Date   CHOLHDL 3.7 03/26/2016   No results found for: HGBA1C     Assessment & Plan:   Problem List Items Addressed This Visit      Digestive   Malignant neoplasm of colon ([place]) - Primary   Relevant Orders   CBC with Differential/Platelet   Comprehensive Metabolic Panel (CMET)      Other   Weight gain   Relevant Orders   CBC with Differential/Platelet   Comprehensive Metabolic Panel (CMET)   Other ascites   Relevant Orders   CBC with Differential/Platelet   Comprehensive Metabolic Panel (CMET)   Colostomy present ([facility])   Adrenal nodule ([facility])     Malignant neoplasm of colon, unspecified part of colon ([place]) - Plan: CBC with Differential/Platelet, Comprehensive Metabolic Panel (CMET)  Other ascites - Plan: CBC with Differential/Platelet, Comprehensive Metabolic Panel (CMET)  Weight gain - Plan: CBC with Differential/Platelet, Comprehensive Metabolic Panel (CMET)  Colostomy present ([place]), Chronic  Adrenal nodule ([place]), Chronic  records were faxed to [phone] Dr. Kathryne Gin medical. referrals recommended GI, surgery and [place] oncology. Patient declines. Continue Lasix 20mg  qd. Recommend ECHO ordered. Patient again declined and declined any referrals. He will call surgeon again and follow up with [place] after that call by phone.   Red Flags discussed. The patient was given clear instructions to go to ER or return to medical center if any red flags develop, symptoms do not improve, worsen or new problems develop. They verbalized understanding.  Advised patient call the office or your primary care doctor for an appointment if no improvement within 72 hours or if any symptoms change or worsen at any time  Advised ER or urgent Care  if after hours or on weekend. Call 911 for emergency symptoms at any time.Patinet verbalized understanding of all instructions given/reviewed and treatment plan and has no further questions or concerns at this time.      Return in about 2 weeks (around 04/17/2020), or if symptoms worsen or fail to improve, for at any time for any worsening symptoms, Go to Emergency room/ urgent care if worse.   Marcille Buffy, FNP

## 2020-04-03 NOTE — Progress Notes (Signed)
Creatinine mild elevation - likely mildly dehydrated with Lasix. ALT elevated. Recommend follow up still with GI , surgery consult and oncology.

## 2020-04-04 ENCOUNTER — Telehealth: Payer: Self-pay

## 2020-04-04 NOTE — Telephone Encounter (Signed)
Copied from [place] [phone]. Topic: General - Other >> Apr 03, 2020  4:44 PM Yvette Rack wrote: Reason for CRM: Pt stated he was returning the call to [facility] Peace to provide fax# [phone] where he needs his medical records sent.

## 2020-04-04 NOTE — Telephone Encounter (Signed)
Documents will be faxed. KW

## 2020-04-04 NOTE — Telephone Encounter (Signed)
Is this for last office visit and images that I need to fax?KW

## 2020-04-04 NOTE — Telephone Encounter (Signed)
Yes please all visit notes pertaining to his edema, labs, chest x ray, CT chest/ abdomen

## 2020-04-26 ENCOUNTER — Ambulatory Visit: Payer: Self-pay | Admitting: Adult Health

## 2020-05-10 ENCOUNTER — Ambulatory Visit: Payer: Self-pay | Admitting: Adult Health

## 2020-05-13 ENCOUNTER — Other Ambulatory Visit: Payer: Self-pay

## 2020-05-13 ENCOUNTER — Encounter: Payer: Self-pay | Admitting: Family Medicine

## 2020-05-13 ENCOUNTER — Ambulatory Visit: Payer: Federal, State, Local not specified - PPO | Admitting: Family Medicine

## 2020-05-13 VITALS — BP 110/72 | HR 80 | Temp 98.2°F | Wt 253.0 lb

## 2020-05-13 DIAGNOSIS — E278 Other specified disorders of adrenal gland: Secondary | ICD-10-CM

## 2020-05-13 DIAGNOSIS — Z85038 Personal history of other malignant neoplasm of large intestine: Secondary | ICD-10-CM

## 2020-05-13 DIAGNOSIS — R601 Generalized edema: Secondary | ICD-10-CM

## 2020-05-13 MED ORDER — FUROSEMIDE 40 MG PO TABS: 40.0000 mg | ORAL_TABLET | Freq: Every day | ORAL | 1 refills | Status: DC

## 2020-05-13 NOTE — Patient Instructions (Signed)
Edema  Edema is when you have too much fluid in your body or under your skin. Edema may make your legs, feet, and ankles swell up. Swelling is also common in looser tissues, like around your eyes. This is a common condition. It gets more common as you get older. There are many possible causes of edema. Eating too much salt (sodium) and being on your feet or sitting for a long time can cause edema in your legs, feet, and ankles. Hot weather may make edema worse. Edema is usually painless. Your skin may look swollen or shiny. Follow these instructions at home:  Keep the swollen body part raised (elevated) above the level of your heart when you are sitting or lying down.  Do not sit still or stand for a long time.  Do not wear tight clothes. Do not wear garters on your upper legs.  Exercise your legs. This can help the swelling go down.  Wear elastic bandages or support stockings as told by your doctor.  Eat a low-salt (low-sodium) diet to reduce fluid as told by your doctor.  Depending on the cause of your swelling, you may need to limit how much fluid you drink (fluid restriction).  Take over-the-counter and prescription medicines only as told by your doctor. Contact a doctor if:  Treatment is not working.  You have heart, liver, or kidney disease and have symptoms of edema.  You have sudden and unexplained weight gain. Get help right away if:  You have shortness of breath or chest pain.  You cannot breathe when you lie down.  You have pain, redness, or warmth in the swollen areas.  You have heart, liver, or kidney disease and get edema all of a sudden.  You have a fever and your symptoms get worse all of a sudden. Summary  Edema is when you have too much fluid in your body or under your skin.  Edema may make your legs, feet, and ankles swell up. Swelling is also common in looser tissues, like around your eyes.  Raise (elevate) the swollen body part above the level of your  heart when you are sitting or lying down.  Follow your doctor's instructions about diet and how much fluid you can drink (fluid restriction). This information is not intended to replace advice given to you by your health care provider. Make sure you discuss any questions you have with your health care provider. Document Revised: 09/03/2017 Document Reviewed: 09/18/2016 Elsevier Patient Education  2020 Elsevier Inc.  

## 2020-05-13 NOTE — Progress Notes (Signed)
Established patient visit   Patient: Sean Bates   DOB: 1971/03/04   49 y.o. Male  MRN: [id number] Visit Date: 05/13/2020  Today's healthcare provider: Lavon Paganini, MD   Chief Complaint  Patient presents with  . Edema   Subjective    HPI  Sean Bates presents today with nearly two months of generalized edema. After his most recent surgery (see below), he gained 40 lbs in 2.5 weeks. He was seen by Laverna Peace 3 times in July for this edema. His work up at this time included  CBC, CMP, BNP, lipase, amylase, chest x-ray, and CT of the chest/abdomen/pelvis. This imaging wsa notable for an adrenal nodule.  He was given "fluid pills" for two weeks (furosemide 10mg  then 20mg ) and had increased urination but minimal decrease in swelling. This was stopped the first week of August.  He is "still holding water". He reports whole body swelling including his joints, around his eyes, his legs, and some in his scrotum. He tries to keep moving and put his feet up, but this has not decreased the edema. He also endorses some chest tightness and difficulty breathing.  History of colon cancer: - First diagnosed September 2006 - Chemotherapy in October/November 2006 - Surgery in February 2007 - Surgery to close colostomy hole August 2007 - Last saw an oncologist 6-7 years ago (2014 or 2015) - Surgery to replace colostomy bag in May 2021 (in [place], [facility]) - Does NOT currently have colon cancer "once they took out the lower colon"  Patient Active Problem List   Diagnosis Date Noted  . Adrenal nodule ([place]) 04/03/2020  . Weight gain 03/22/2020  . Generalized edema 03/22/2020  . Other ascites 03/22/2020  . Encounter for assessment of ascites 03/22/2020  . Colostomy present ([place]) 03/22/2020  . H/O malignant neoplasm of colon 03/11/2015  . H/O malignant neoplasm of rectum 09/20/2014  . 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 Familial polyposis 09/20/2014  .  Current tobacco use 11/16/2006   Past Medical History:  Diagnosis Date  . Cancer [facility])    history of colon  . Malignant neoplasm of colon ([place]) 03/22/2020   Past Surgical History:  Procedure Laterality Date  . COLON SURGERY  2006    Medications: Outpatient Medications Prior to Visit  Medication Sig  . aspirin 325 MG tablet Take by mouth.  . Calcium Carbonate-Vitamin D 600-400 MG-UNIT tablet Take by mouth.  . Multiple Vitamin (MULTIVITAMIN) capsule Take 1 capsule by mouth daily.  . [DISCONTINUED] furosemide (LASIX) 20 MG tablet Take 1 tablet (20 mg total) by mouth daily. (Patient not taking: Reported on 05/13/2020)   No facility-administered medications prior to visit.    Review of Systems  Eyes: Positive for visual disturbance.  Respiratory: Positive for chest tightness and shortness of breath.   Cardiovascular: Positive for leg swelling.  Neurological: Negative for headaches.      Objective    BP 110/72 (BP Location: Right Arm, Patient Position: Sitting, Cuff Size: Large)   Pulse 80   Temp 98.2 F (36.8 C) (Oral)   Wt 253 lb (114.8 kg)   BMI 30.00 kg/m    Physical Exam Constitutional:      Appearance: Normal appearance. He is obese.  HENT:     Head: Normocephalic and atraumatic. Right periorbital erythema and left periorbital erythema present.     Right Ear: Tympanic membrane, ear canal and external ear normal.     Left Ear: Tympanic membrane, ear canal  and external ear normal.  Eyes:     Pupils: Pupils are equal, round, and reactive to light.  Cardiovascular:     Rate and Rhythm: Normal rate and regular rhythm.     Heart sounds: Normal heart sounds. No murmur heard.   Pulmonary:     Effort: Pulmonary effort is normal.     Comments: Faint crackles in lung bases Abdominal:     General: A surgical scar is present. The ostomy site is clean. There is distension.     Palpations: There is no fluid wave.     Tenderness: There is no abdominal tenderness.      Hernia: No hernia is present.  Musculoskeletal:     Cervical back: Normal range of motion and neck supple.     Right lower leg: 1+ Pitting Edema present.     Left lower leg: 1+ Pitting Edema present.  Skin:    General: Skin is warm.     Comments: Cyst near R elbow  Neurological:     General: No focal deficit present.     Mental Status: He is alert. Mental status is at baseline.     Gait: Gait normal.  Psychiatric:        Mood and Affect: Mood normal.        Behavior: Behavior normal.        Thought Content: Thought content normal.        Judgment: Judgment normal.     No results found for any visits on 05/13/20.  Assessment & Plan     Sean Bates presents with generalized edema, which is most likely due to a cardiac, hepatic, or renal origin. This is unlikely to be vascular in origin given that the swelling extends beyond gravity-dependent areas. Most recent labs in July showed increased creatinine, slight increase in ALT (ultimately lower than the previous visit); all else including BNP was normal.  1. Generalized edema - Comprehensive metabolic panel - CBC w/Diff/Platelet - INR/PT - ECHOCARDIOGRAM COMPLETE; Future  2. Adrenal nodule ([place]) This is concerning given his history of colon cancer. - CT ADRENAL ABD WO; Future (patient reports he will receive this test after the echocardiogram.  3. History of colon cancer  Return in about 4 weeks (around 06/10/2020) for chronic disease f/u.      Rodrigo Ran, MS3

## 2020-05-14 ENCOUNTER — Ambulatory Visit: Payer: Self-pay | Admitting: Adult Health

## 2020-05-14 LAB — PROTIME-INR
INR: 1 (ref 0.9–1.2)
Prothrombin Time: 10.2 s (ref 9.1–12.0)

## 2020-05-14 LAB — CBC WITH DIFFERENTIAL/PLATELET
Basophils Absolute: 0.1 10*3/uL (ref 0.0–0.2)
Basos: 1 %
EOS (ABSOLUTE): 0.2 10*3/uL (ref 0.0–0.4)
Eos: 3 %
Hematocrit: 41.5 % (ref 37.5–51.0)
Hemoglobin: 14.1 g/dL (ref 13.0–17.7)
Immature Grans (Abs): 0.1 10*3/uL (ref 0.0–0.1)
Immature Granulocytes: 1 %
Lymphocytes Absolute: 2.2 10*3/uL (ref 0.7–3.1)
Lymphs: 32 %
MCH: 29.7 pg (ref 26.6–33.0)
MCHC: 34 g/dL (ref 31.5–35.7)
MCV: 87 fL (ref 79–97)
Monocytes Absolute: 0.7 10*3/uL (ref 0.1–0.9)
Monocytes: 10 %
Neutrophils Absolute: 3.8 10*3/uL (ref 1.4–7.0)
Neutrophils: 53 %
Platelets: 339 10*3/uL (ref 150–450)
RBC: 4.75 x10E6/uL (ref 4.14–5.80)
RDW: 13.5 % (ref 11.6–15.4)
WBC: 7.1 10*3/uL (ref 3.4–10.8)

## 2020-05-14 LAB — COMPREHENSIVE METABOLIC PANEL
ALT: 46 IU/L — ABNORMAL HIGH (ref 0–44)
AST: 33 IU/L (ref 0–40)
Albumin/Globulin Ratio: 1.3 (ref 1.2–2.2)
Albumin: 4.2 g/dL (ref 4.0–5.0)
Alkaline Phosphatase: 91 IU/L (ref 48–121)
BUN/Creatinine Ratio: 17 (ref 9–20)
BUN: 18 mg/dL (ref 6–24)
Bilirubin Total: 0.2 mg/dL (ref 0.0–1.2)
CO2: 18 mmol/L — ABNORMAL LOW (ref 20–29)
Calcium: 9.3 mg/dL (ref 8.7–10.2)
Chloride: 107 mmol/L — ABNORMAL HIGH (ref 96–106)
Creatinine, Ser: 1.09 mg/dL (ref 0.76–1.27)
GFR calc Af Amer: 91 mL/min/{1.73_m2} (ref >59)
GFR calc non Af Amer: 79 mL/min/{1.73_m2} (ref >59)
Globulin, Total: 3.3 g/dL (ref 1.5–4.5)
Glucose: 83 mg/dL (ref 65–99)
Potassium: 4.5 mmol/L (ref 3.5–5.2)
Sodium: 141 mmol/L (ref 134–144)
Total Protein: 7.5 g/dL (ref 6.0–8.5)

## 2020-05-23 ENCOUNTER — Ambulatory Visit
Admission: RE | Admit: 2020-05-23 | Discharge: 2020-05-23 | Disposition: A | Discharge status: Home / Self Care | Payer: Federal, State, Local not specified - PPO | Source: Ambulatory Visit | Attending: Family Medicine | Admitting: Family Medicine

## 2020-05-23 ENCOUNTER — Other Ambulatory Visit: Payer: Self-pay

## 2020-05-23 DIAGNOSIS — R601 Generalized edema: Secondary | ICD-10-CM

## 2020-05-23 LAB — ECHOCARDIOGRAM COMPLETE
AR max vel: 2.57 cm2
AV Area VTI: 3.42 cm2
AV Area mean vel: 2.7 cm2
AV Mean grad: 1.5 mmHg
AV Peak grad: 3 mmHg
Ao pk vel: 0.86 m/s
Area-P 1/2: 2.82 cm2
Calc EF: 50.9 %
S' Lateral: 3.89 cm
Single Plane A2C EF: 47.6 %
Single Plane A4C EF: 51.6 %

## 2020-05-23 NOTE — Progress Notes (Signed)
*  PRELIMINARY RESULTS* Echocardiogram 2D Echocardiogram has been performed.  Sherrie Sport 05/23/2020, 10:28 AM

## 2020-05-27 ENCOUNTER — Telehealth: Payer: Self-pay

## 2020-05-27 NOTE — Telephone Encounter (Signed)
-----   Message from Virginia Crews, MD sent at 05/24/2020  8:01 AM EDT ----- Normal echocardiogram, except for grade 1 diastolic dysfunction.  This means that the relaxation phase of the heart is slightly impaired.  Could be causing swelling.  Recommend continuing diuretics as prescribed.

## 2020-05-27 NOTE — Telephone Encounter (Signed)
Pt advised.   Thanks,   -Zayan Delvecchio  

## 2020-06-20 ENCOUNTER — Other Ambulatory Visit: Payer: Self-pay

## 2020-06-20 ENCOUNTER — Ambulatory Visit (INDEPENDENT_AMBULATORY_CARE_PROVIDER_SITE_OTHER): Payer: Federal, State, Local not specified - PPO | Admitting: Adult Health

## 2020-06-20 ENCOUNTER — Encounter: Payer: Self-pay | Admitting: Adult Health

## 2020-06-20 VITALS — BP 120/67 | HR 74 | Temp 98.2°F | Resp 16 | Wt 266.8 lb

## 2020-06-20 DIAGNOSIS — Z933 Colostomy status: Secondary | ICD-10-CM | POA: Diagnosis not present

## 2020-06-20 DIAGNOSIS — E278 Other specified disorders of adrenal gland: Secondary | ICD-10-CM

## 2020-06-20 DIAGNOSIS — R601 Generalized edema: Secondary | ICD-10-CM

## 2020-06-20 DIAGNOSIS — Z85038 Personal history of other malignant neoplasm of large intestine: Secondary | ICD-10-CM | POA: Diagnosis not present

## 2020-06-20 NOTE — Patient Instructions (Signed)
Call radiology to schedule CT of adrenal tumor that Dr. B ordered previously. You should hear from the cancer center and endocrinology within 1 week if you do not please call the office.    Edema  Edema is when you have too much fluid in your body or under your skin. Edema may make your legs, feet, and ankles swell up. Swelling is also common in looser tissues, like around your eyes. This is a common condition. It gets more common as you get older. There are many possible causes of edema. Eating too much salt (sodium) and being on your feet or sitting for a long time can cause edema in your legs, feet, and ankles. Hot weather may make edema worse. Edema is usually painless. Your skin may look swollen or shiny. Follow these instructions at home:  Keep the swollen body part raised (elevated) above the level of your heart when you are sitting or lying down.  Do not sit still or stand for a long time.  Do not wear tight clothes. Do not wear garters on your upper legs.  Exercise your legs. This can help the swelling go down.  Wear elastic bandages or support stockings as told by your doctor.  Eat a low-salt (low-sodium) diet to reduce fluid as told by your doctor.  Depending on the cause of your swelling, you may need to limit how much fluid you drink (fluid restriction).  Take over-the-counter and prescription medicines only as told by your doctor. Contact a doctor if:  Treatment is not working.  You have heart, liver, or kidney disease and have symptoms of edema.  You have sudden and unexplained weight gain. Get help right away if:  You have shortness of breath or chest pain.  You cannot breathe when you lie down.  You have pain, redness, or warmth in the swollen areas.  You have heart, liver, or kidney disease and get edema all of a sudden.  You have a fever and your symptoms get worse all of a sudden. Summary  Edema is when you have too much fluid in your body or under your  skin.  Edema may make your legs, feet, and ankles swell up. Swelling is also common in looser tissues, like around your eyes.  Raise (elevate) the swollen body part above the level of your heart when you are sitting or lying down.  Follow your doctor's instructions about diet and how much fluid you can drink (fluid restriction). This information is not intended to replace advice given to you by your health care provider. Make sure you discuss any questions you have with your health care provider. Document Revised: 09/03/2017 Document Reviewed: 09/18/2016 Elsevier Patient Education  2020 [employer].

## 2020-06-20 NOTE — Progress Notes (Signed)
Established patient visit   Patient: Sean Bates   DOB: Nov 21, 1970   49 y.o. Male  MRN: [id number] Visit Date: 06/20/2020  Today's healthcare provider: Marcille Buffy, FNP   Chief Complaint  Patient presents with   Follow-up   Subjective    HPI  Follow up for Adrenal Nodule  The patient was last seen for this 1 months ago. Changes made at last visit include none, CT of Adrenal abdomen  He feels that condition is Unchanged. Patient states that he [place] his specialist [place] state but has no plans to follow back up with him. Patient states that he is still having swelling in his lower legs.    He had an adrenal CT ordered and has not gone for this. He has been referred back to oncology given his symptoms and history. Echo ----- Message from Virginia Crews, MD sent at 05/24/2020  8:01 AM EDT ----- Nnormal echocardiogram, except for grade 1 diastolic dysfunction.  This means that the relaxation phase of the heart is slightly impaired.  Could be causing swelling.  Recommend continuing diuretics as prescribed. Lasix has not helped.   He previously declined all referrals due to he wanted to speak to surgeon who performed his colostomy see notes, reviewed.  Patient  denies any fever,chills, rash, chest pain, shortness of breath, nausea, vomiting, or diarrhea.   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   Calcium Carbonate-Vitamin D 600-400 MG-UNIT tablet Take by mouth.   furosemide (LASIX) 40 MG tablet Take 1 tablet (40 mg total) by mouth daily.   Multiple Vitamin (MULTIVITAMIN) capsule Take 1 capsule by mouth daily.   No facility-administered medications prior to visit.    Review of Systems  Constitutional: Negative.  Negative for activity change, appetite change, chills, diaphoresis, fatigue, fever and unexpected weight change.       Generalized edema   HENT: Negative.   Respiratory: Negative.   Cardiovascular: Positive for leg swelling. Negative for chest pain and palpitations.  Genitourinary: Negative.   Musculoskeletal: Negative.   Skin: Negative for rash.  Neurological: Negative.   Psychiatric/Behavioral: Negative.  Negative for agitation.      Objective    BP 120/67    Pulse 74    Temp 98.2 F (36.8 C) (Oral)    Resp 16    Wt 266 lb 12.8 oz (121 kg)    SpO2 96%    BMI 31.64 kg/m    Physical Exam Constitutional:      General: He is not in acute distress.    Appearance: He is not ill-appearing, toxic-appearing or diaphoretic.     Comments: Swollen face  and generalized swelling   HENT:     Head: Normocephalic and atraumatic.     Right Ear: External ear normal.     Left Ear: External ear normal.     Nose: Nose normal. No congestion or rhinorrhea.     Mouth/Throat:     Mouth: Mucous membranes are moist.     Pharynx: No oropharyngeal exudate or posterior oropharyngeal erythema.  Eyes:     General: No scleral icterus.       Right eye: No discharge.        Left eye: No discharge.     Extraocular Movements: Extraocular movements intact.     Conjunctiva/sclera: Conjunctivae  normal.     Pupils: Pupils are equal, round, and reactive to light.  Neck:     Vascular: No carotid bruit.  Cardiovascular:     Rate and Rhythm: Normal rate and regular rhythm.     Pulses: Normal pulses.     Heart sounds: Normal heart sounds. No murmur heard.  No friction rub. No gallop.   Pulmonary:     Effort: Pulmonary effort is normal. No respiratory distress.     Breath sounds: Normal breath sounds. No stridor. No wheezing, rhonchi or rales.  Chest:     Chest wall: No tenderness.  Abdominal:     General: Bowel sounds are normal. There is distension.     Palpations: Abdomen is soft.     Tenderness: There is no abdominal tenderness. There is no right CVA tenderness, left CVA tenderness or guarding.     Comments: Colostomy bag present   Musculoskeletal:        General: Swelling present. No tenderness, deformity or signs of injury.     Cervical back: Normal range of motion and neck supple. No rigidity or tenderness.     Right lower leg: No edema.     Left lower leg: No edema.  Lymphadenopathy:     Cervical: No cervical adenopathy.  Skin:    General: Skin is warm.     Capillary Refill: Capillary refill takes less than 2 seconds.  Neurological:     Mental Status: He is alert and oriented to person, place, and time.     Motor: No weakness.     Gait: Gait normal.  Psychiatric:        Mood and Affect: Mood normal.        Behavior: Behavior normal.        Thought Content: Thought content normal.        Judgment: Judgment normal.      No results found for any visits on 06/20/20.  Assessment & Plan     Adrenal nodule ([place]) - Plan: Cortisol, CBC with Differential/Platelet, Comprehensive Metabolic Panel (CMET), Ambulatory referral to Oncology, Ambulatory referral to Endocrinology  Generalized edema - Plan: Cortisol, CBC with Differential/Platelet, Comprehensive Metabolic Panel (CMET), Ambulatory referral to Gastroenterology, Ambulatory referral to Cardiology  History of colon  cancer  Colostomy status ([place]) - Plan: Ambulatory referral to Gastroenterology  Continue lasix as prescribed.    Orders Placed This Encounter  Procedures   Cortisol   CBC with Differential/Platelet   Comprehensive Metabolic Panel (CMET)   Ambulatory referral to Oncology   Ambulatory referral to Endocrinology   Ambulatory referral to Gastroenterology   Ambulatory referral to Cardiology   Needs further work up for adrenal nodule it is concerning given his past malignancy of colon.  Return in about 1 week (around 06/27/2020), or if symptoms worsen or fail to  improve, for at any time for any worsening symptoms, Go to Emergency room/ urgent care if worse.     If not heard from referrals within 1 week call the office.  Labs ordered.   Addressed acute and or chronic medical problems today requiring 32 minutes reviewing patients medical record,labs, counseling patient regarding patient's conditions, any medications, answering questions regarding health, and coordination of care as needed. After visit summary patient given copy and reviewed.   Red Flags discussed. The patient was given clear instructions to go to ER or return to medical center if any red flags develop, symptoms do not improve, worsen or new problems develop. They verbalized understanding.  Marcille Buffy, [place] [phone] (phone) [phone] (fax)  [place]

## 2020-06-26 ENCOUNTER — Other Ambulatory Visit: Payer: Self-pay

## 2020-06-26 ENCOUNTER — Inpatient Hospital Stay: Payer: Federal, State, Local not specified - PPO | Attending: Internal Medicine | Admitting: Internal Medicine

## 2020-06-26 ENCOUNTER — Inpatient Hospital Stay: Payer: Federal, State, Local not specified - PPO

## 2020-06-26 DIAGNOSIS — Z933 Colostomy status: Secondary | ICD-10-CM | POA: Insufficient documentation

## 2020-06-26 DIAGNOSIS — Z85048 Personal history of other malignant neoplasm of rectum, rectosigmoid junction, and anus: Secondary | ICD-10-CM | POA: Diagnosis not present

## 2020-06-26 DIAGNOSIS — D369 Benign neoplasm, unspecified site: Secondary | ICD-10-CM | POA: Diagnosis not present

## 2020-06-26 DIAGNOSIS — K529 Noninfective gastroenteritis and colitis, unspecified: Secondary | ICD-10-CM | POA: Insufficient documentation

## 2020-06-26 DIAGNOSIS — C20 Malignant neoplasm of rectum: Secondary | ICD-10-CM

## 2020-06-26 DIAGNOSIS — C186 Malignant neoplasm of descending colon: Secondary | ICD-10-CM

## 2020-06-26 DIAGNOSIS — D3502 Benign neoplasm of left adrenal gland: Secondary | ICD-10-CM | POA: Insufficient documentation

## 2020-06-26 DIAGNOSIS — E278 Other specified disorders of adrenal gland: Secondary | ICD-10-CM

## 2020-06-26 NOTE — Assessment & Plan Note (Addendum)
#   LEFT ADRENAL NODULE- ~46mm-incidentally noted on imaging in July 2021.  Would recommend a CT of the abdomen pelvis with and without contrast; discussed with the radiologist.   # Rectal cancer- T2N1- Stage III [2007]check CEA today.  Clinically less likely to have recurrent malignancy so many years out post therapy.  Await above imaging.  #Chronic diarrhea s/p revision colostomy [at [employer]]-currently improved.  #Multiple polyposis syndrome-s/p genetic evaluation at [facility] per patient]; patient's son-negative for testing.  Recommend continue close surveillance with colonoscopies through the stump.  Thank you Ms.Flinchum for allowing me to participate in the care of your pleasant patient. Please do not hesitate to contact me with questions or concerns in the interim.  # DISPOSITION: # labs today order CEA today # follow up TBD- Dr.B  # I reviewed the blood work- with the patient in detail; also reviewed the imaging independently [as summarized above]; and with the patient in detail.

## 2020-06-26 NOTE — Progress Notes (Signed)
[place] CONSULT NOTE  Patient Care Team: Flinchum, Kelby Aline, FNP as PCP - General (Family Medicine)  CHIEF COMPLAINTS/PURPOSE OF CONSULTATION: Adrenal nodule-LEFT  #  Oncology History Overview Note  # RECTAL CANCER- T3N1- stage III- s/p chemo- RT- surgery [[facility]] with colostomy; s/p take down; May 2021-colostomy [sec to chronic diarrheaat [employer]]  #  ?familial/multiple polyposis- s/p genetics evaluation at [facility]  ------------------------------------------------------------------------------------- Prior abdominal perineal resection with sigmoid colostomy in LEFT mid abdomen.   Diffuse stranding in the presacral space and rectal bed consistent with prior resection.   Ill-defined low-attenuation within the rectal bed 18 x 11 mm with a single focus of gas question postoperative collection or tiny abscess.   Small BILATERAL inguinal hernias containing fat.   No acute intrathoracic abnormalities.   Tiny LEFT adrenal nodule 10 x 8 mm, indeterminate attenuation and washout; in light of history of colorectal cancer, recommend characterization by adrenal CT.   Aortic Atherosclerosis (ICD10-I70.0).     Electronically Signed   By: Lavonia Dana M.D.   On: 03/22/2020 13:41   Cancer of left colon ([place])  06/26/2020 Initial Diagnosis   Cancer of left colon ([place])   Rectal cancer ([place])  06/26/2020 Initial Diagnosis   Rectal cancer ([place])     HISTORY OF PRESENTING ILLNESS:  Sean Bates 49 y.o.  male patient with a history of rectal cancer; has been referred to [place] regarding incidental finding of left adrenal nodule.  Patient states that patient underwent neoadjuvant chemoradiation for cervical cancer.  This was followed by LAR surgery at [facility].  Since his rectal surgery-LAR-chronic diarrhea. Patient states that since he had colostomy done in May 2021 at [place] with diarrhea gotten better.  Incidentally patient is CT scan in July 2021  for abdominal pain-that showed no acute process however showed 10 mm adrenal nodule.  He has been referred for further recommendations.  Review of Systems  Constitutional: Negative for chills, diaphoresis, fever, malaise/fatigue and weight loss.  HENT: Negative for nosebleeds and sore throat.   Eyes: Negative for double vision.  Respiratory: Negative for cough, hemoptysis, sputum production, shortness of breath and wheezing.   Cardiovascular: Negative for chest pain, palpitations, orthopnea and leg swelling.  Gastrointestinal: Negative for abdominal pain, blood in stool, constipation, diarrhea, heartburn, melena, nausea and vomiting.  Genitourinary: Negative for dysuria, frequency and urgency.  Musculoskeletal: Negative for back pain and joint pain.  Skin: Negative.  Negative for itching and rash.  Neurological: Negative for dizziness, tingling, focal weakness, weakness and headaches.  Endo/Heme/Allergies: Does not bruise/bleed easily.  Psychiatric/Behavioral: Negative for depression. The patient is not nervous/anxious and does not have insomnia.      MEDICAL HISTORY:  Past Medical History:  Diagnosis Date  . Cancer [facility])    history of colon  . Malignant neoplasm of colon ([place]) 03/22/2020    SURGICAL HISTORY: Past Surgical History:  Procedure Laterality Date  . COLON SURGERY  2006    SOCIAL HISTORY: Social History   Socioeconomic History  . Marital status: Single    Spouse name: Not on file  . Number of children: 1  . Years of education: 71  . Highest education level: Not on file  Occupational History    Employer: [place] [employer]  Tobacco Use  . Smoking status: Former Smoker    Packs/day: 1.00    Years: 22.00    Pack years: 22.00    Types: Cigarettes  . Smokeless tobacco: Former Systems developer    Quit date:  09/13/1986  Substance and Sexual Activity  . Alcohol use: Yes    Alcohol/week: 0.0 standard drinks    Comment: occasional  . Drug use: No  . Sexual activity: Not on file   Other Topics Concern  . Not on file  Social History Narrative  . Not on file   Social Determinants of Health   Financial Resource Strain:   . Difficulty of Paying Living Expenses: Not on file  Food Insecurity:   . Worried About Charity fundraiser in the Last Year: Not on file  . Ran Out of Food in the Last Year: Not on file  Transportation Needs:   . Lack of Transportation (Medical): Not on file  . Lack of Transportation (Non-Medical): Not on file  Physical Activity:   . Days of Exercise per Week: Not on file  . Minutes of Exercise per Session: Not on file  Stress:   . Feeling of Stress : Not on file  Social Connections:   . Frequency of Communication with Friends and Family: Not on file  . Frequency of Social Gatherings with Friends and Family: Not on file  . Attends Religious Services: Not on file  . Active Member of Clubs or Organizations: Not on file  . Attends Archivist Meetings: Not on file  . Marital Status: Not on file  Intimate Partner Violence:   . Fear of Current or Ex-Partner: Not on file  . Emotionally Abused: Not on file  . Physically Abused: Not on file  . Sexually Abused: Not on file    FAMILY HISTORY: Family History  Problem Relation Age of Onset  . Cancer Paternal Aunt   . Asthma Maternal Grandmother   . Cancer Paternal Grandmother   . Healthy Mother   . Diabetes Father     ALLERGIES:  has No Known Allergies.  MEDICATIONS:  Current Outpatient Medications  Medication Sig Dispense Refill  . aspirin 325 MG tablet Take by mouth.    . Calcium Carbonate-Vitamin D 600-400 MG-UNIT tablet Take by mouth.    . furosemide (LASIX) 40 MG tablet Take 1 tablet (40 mg total) by mouth daily. 30 tablet 1  . Multiple Vitamin (MULTIVITAMIN) capsule Take 1 capsule by mouth daily.    . Potassium 75 MG TABS Take by mouth.    . Zinc Sulfate (ZINC 15 PO) Take by mouth.     No current facility-administered medications for this visit.      Marland Kitchen  PHYSICAL  EXAMINATION: ECOG PERFORMANCE STATUS: 0 - Asymptomatic  Vitals:   06/26/20 1400  BP: 106/64  Pulse: 85  Resp: 18  Temp: 98.7 F (37.1 C)  SpO2: 98%   Filed Weights   06/26/20 1400  Weight: 246 lb (111.6 kg)    Physical Exam HENT:     Head: Normocephalic and atraumatic.     Mouth/Throat:     Pharynx: No oropharyngeal exudate.  Eyes:     Pupils: Pupils are equal, round, and reactive to light.  Cardiovascular:     Rate and Rhythm: Normal rate and regular rhythm.  Pulmonary:     Effort: Pulmonary effort is normal. No respiratory distress.     Breath sounds: Normal breath sounds. No wheezing.  Abdominal:     General: Bowel sounds are normal. There is no distension.     Palpations: Abdomen is soft. There is no mass.     Tenderness: There is no abdominal tenderness. There is no guarding or rebound.  Musculoskeletal:  General: No tenderness. Normal range of motion.     Cervical back: Normal range of motion and neck supple.  Skin:    General: Skin is warm.  Neurological:     Mental Status: He is alert and oriented to person, place, and time.  Psychiatric:        Mood and Affect: Affect normal.      LABORATORY DATA:  I have reviewed the data as listed Lab Results  Component Value Date   WBC 7.1 05/13/2020   HGB 14.1 05/13/2020   HCT 41.5 05/13/2020   MCV 87 05/13/2020   PLT 339 05/13/2020   Recent Labs    03/22/20 1443 04/03/20 1403 05/13/20 1511  NA 136 139 141  K 3.8 3.9 4.5  CL 104 103 107*  CO2 25 26 18*  GLUCOSE 116* 118* 83  BUN 13 17 18   CREATININE 1.12 1.34* 1.09  CALCIUM 8.7* 9.2 9.3  GFRNONAA >60 >60 79  GFRAA >60 >60 91  PROT 7.1 7.3 7.5  ALBUMIN 3.7 3.9 4.2  AST 45* 40 33  ALT 72* 57* 46*  ALKPHOS 61 76 91  BILITOT 0.7 0.6 0.2    RADIOGRAPHIC STUDIES: I have personally reviewed the radiological images as listed and agreed with the findings in the report. No results found.  ASSESSMENT & PLAN:   Adrenal nodule ([place]) # LEFT  ADRENAL NODULE- ~65mm-incidentally noted on imaging in July 2021.  Would recommend a CT of the abdomen pelvis with and without contrast; discussed with the radiologist.   # Rectal cancer- T2N1- Stage III [2007]check CEA today.  Clinically less likely to have recurrent malignancy so many years out post therapy.  Await above imaging.  #Chronic diarrhea s/p revision colostomy [at [employer]]-currently improved.  #Multiple polyposis syndrome-s/p genetic evaluation at [facility] per patient]; patient's son-negative for testing.  Recommend continue close surveillance with colonoscopies through the stump.  Thank you Ms.Flinchum for allowing me to participate in the care of your pleasant patient. Please do not hesitate to contact me with questions or concerns in the interim.  # DISPOSITION: # labs today order CEA today # follow up TBD- Dr.B  # I reviewed the blood work- with the patient in detail; also reviewed the imaging independently [as summarized above]; and with the patient in detail.     All questions were answered. The patient knows to call the clinic with any problems, questions or concerns.    Cammie Sickle, MD 06/26/2020 3:46 PM

## 2020-06-27 LAB — CEA: CEA: 1.7 ng/mL (ref 0.0–4.7)

## 2020-07-23 ENCOUNTER — Ambulatory Visit: Payer: Self-pay | Admitting: Adult Health

## 2020-08-01 ENCOUNTER — Encounter: Payer: Self-pay | Admitting: Endocrinology

## 2020-08-01 ENCOUNTER — Other Ambulatory Visit: Payer: Self-pay

## 2020-08-01 ENCOUNTER — Ambulatory Visit (INDEPENDENT_AMBULATORY_CARE_PROVIDER_SITE_OTHER): Payer: Federal, State, Local not specified - PPO | Admitting: Endocrinology

## 2020-08-01 VITALS — BP 138/74 | HR 78 | Ht 77.0 in | Wt 267.0 lb

## 2020-08-01 DIAGNOSIS — E278 Other specified disorders of adrenal gland: Secondary | ICD-10-CM

## 2020-08-01 MED ORDER — DEXAMETHASONE 1 MG PO TABS
1.0000 mg | ORAL_TABLET | ORAL | 0 refills | Status: DC
Start: 2020-08-01 — End: 2020-08-01

## 2020-08-01 MED ORDER — DEXAMETHASONE 1 MG PO TABS: 1.0000 mg | ORAL_TABLET | ORAL | 0 refills | Status: DC

## 2020-08-01 NOTE — Patient Instructions (Addendum)
For your next CT scan, please ask that a copy be sent here. Let's check a 24-HR urine test. After that is done, you should do a "dexamethasone suppression test."  For this, you would take dexamethasone 1 mg at 10 pm (I have sent a prescription to your pharmacy), then come in for a "cortisol" blood test the next morning before 9 am.  You do not need to be fasting for this test.  If these tests are normal, no further testing is needed.

## 2020-08-01 NOTE — Progress Notes (Signed)
Subjective:    Patient ID: Sean Bates, male    DOB: [birth year], 49 y.o.   MRN: [id number]  HPI Pt is referred by  Laverna Peace, NP, for Adrenal nodule.  He has no h/o adrenal dz.  Next CT is planned to be done in a few more months.  He has no h/o HTN.  He has no h/o adrenal disease, diabetes, or MTC.  He has intermitt headache.   Past Medical History:  Diagnosis Date  . Cancer [facility])    history of colon  . Malignant neoplasm of colon ([place]) 03/22/2020    Past Surgical History:  Procedure Laterality Date  . COLON SURGERY  2006    Social History   Socioeconomic History  . Marital status: Single    Spouse name: Not on file  . Number of children: 1  . Years of education: 95  . Highest education level: Not on file  Occupational History    Employer: [place] [employer]  Tobacco Use  . Smoking status: Former Smoker    Packs/day: 1.00    Years: 22.00    Pack years: 22.00    Types: Cigarettes  . Smokeless tobacco: Former Systems developer    Quit date: 09/13/1986  Substance and Sexual Activity  . Alcohol use: Yes    Alcohol/week: 0.0 standard drinks    Comment: occasional  . Drug use: No  . Sexual activity: Not on file  Other Topics Concern  . Not on file  Social History Narrative  . Not on file   Social Determinants of Health   Financial Resource Strain:   . Difficulty of Paying Living Expenses: Not on file  Food Insecurity:   . Worried About Charity fundraiser in the Last Year: Not on file  . Ran Out of Food in the Last Year: Not on file  Transportation Needs:   . Lack of Transportation (Medical): Not on file  . Lack of Transportation (Non-Medical): Not on file  Physical Activity:   . Days of Exercise per Week: Not on file  . Minutes of Exercise per Session: Not on file  Stress:   . Feeling of Stress : Not on file  Social Connections:   . Frequency of Communication with Friends and Family: Not on file  . Frequency of Social Gatherings with Friends and Family: Not on file    . Attends Religious Services: Not on file  . Active Member of Clubs or Organizations: Not on file  . Attends Archivist Meetings: Not on file  . Marital Status: Not on file  Intimate Partner Violence:   . Fear of Current or Ex-Partner: Not on file  . Emotionally Abused: Not on file  . Physically Abused: Not on file  . Sexually Abused: Not on file    Current Outpatient Medications on File Prior to Visit  Medication Sig Dispense Refill  . aspirin 325 MG tablet Take by mouth.    . Calcium Carbonate-Vitamin D 600-400 MG-UNIT tablet Take by mouth.    . furosemide (LASIX) 40 MG tablet Take 1 tablet (40 mg total) by mouth daily. 30 tablet 1  . Multiple Vitamin (MULTIVITAMIN) capsule Take 1 capsule by mouth daily.    . Potassium 75 MG TABS Take by mouth.    . Zinc Sulfate (ZINC 15 PO) Take by mouth.     No current facility-administered medications on file prior to visit.    No Known Allergies  Family History  Problem Relation Age of  Onset  . Asthma Maternal Grandmother   . Cancer Paternal Grandmother   . Healthy Mother   . Diabetes Father   . Cancer Paternal Aunt   . Adrenal disorder Neg Hx     BP 138/74   Pulse 78   Ht 6\' 5"  (1.956 m)   Wt 267 lb (121.1 kg)   SpO2 95%   BMI 31.66 kg/m    Review of Systems denies flushing, n/v, syncope, diarrhea, sob, palpitations, easy bruising, and excessive diaphoresis.  He has gained weight.      Objective:   Physical Exam VS: see vs page GEN: no distress HEAD: head: no deformity eyes: no periorbital swelling, no proptosis external nose and ears are normal NECK: supple, thyroid is not enlarged CHEST WALL: no deformity LUNGS: clear to auscultation CV: reg rate and rhythm, no murmur.  MUSCULOSKELETAL: gait is normal and steady EXTEMITIES: no deformity.  2+ bilat leg edema NEURO:  cn 2-12 grossly intact.   readily moves all 4's.  sensation is intact to touch on all 4's.   SKIN:  Normal texture and temperature.  No  rash or suspicious lesion is visible.   NODES:  None palpable at the neck.   PSYCH: alert, well-oriented.  Does not appear anxious nor depressed.     Lab Results  Component Value Date   TSH 0.869 12/22/2019   CT: LEFT adrenal nodule 10 x 8 mm image 70, indeterminate attenuation and washout.  I have reviewed outside records, and summarized: Pt was noted to have adrenal nodule, and referred here.  CT was done to f/u colorectal cancer, and adrenal nodule was incidentally noted      Assessment & Plan:  Adrenal nodule, new, uncertain etiology and prognosis.   Patient Instructions  For your next CT scan, please ask that a copy be sent here. Let's check a 24-HR urine test. After that is done, you should do a "dexamethasone suppression test."  For this, you would take dexamethasone 1 mg at 10 pm (I have sent a prescription to your pharmacy), then come in for a "cortisol" blood test the next morning before 9 am.  You do not need to be fasting for this test.  If these tests are normal, no further testing is needed.

## 2020-08-05 ENCOUNTER — Other Ambulatory Visit (INDEPENDENT_AMBULATORY_CARE_PROVIDER_SITE_OTHER): Payer: Federal, State, Local not specified - PPO

## 2020-08-05 ENCOUNTER — Other Ambulatory Visit: Payer: Self-pay

## 2020-08-05 ENCOUNTER — Other Ambulatory Visit: Payer: Self-pay | Admitting: Endocrinology

## 2020-08-05 DIAGNOSIS — E278 Other specified disorders of adrenal gland: Secondary | ICD-10-CM

## 2020-08-05 LAB — CORTISOL: Cortisol, Plasma: 0.6 ug/dL

## 2020-08-10 LAB — METANEPHRINES, PLASMA
Metanephrine, Free: <25 pg/mL (ref <=57)
Normetanephrine, Free: 68 pg/mL (ref <=148)
Total Metanephrines-Plasma: 68 pg/mL (ref <=205)

## 2020-08-13 LAB — CORTISOL, URINE, 24 HOUR
24 Hour urine volume (VMAHVA): 1850 mL
CREATININE, URINE: 2.39 g/(24.h) — ABNORMAL HIGH (ref 0.50–2.15)
Cortisol (Ur), Free: 49.7 mcg/24 h (ref 4.0–50.0)

## 2020-08-13 LAB — CATECHOLAMINES, FRACTIONATED, URINE, 24 HOUR
Calc Total (E+NE): 76 mcg/24 h (ref 26–121)
Creatinine, Urine mg/day-CATEUR: 2.52 g/(24.h) — ABNORMAL HIGH (ref 0.50–2.15)
Dopamine 24 Hr Urine: 281 mcg/24 h (ref 52–480)
Epinephrine, 24H, Ur: 4 mcg/24 h (ref 2–24)
Norepinephrine, 24H, Ur: 72 mcg/24 h (ref 15–100)
Total Volume: 1850 mL

## 2020-08-13 LAB — METANEPHRINES, URINE, 24 HOUR
Metaneph Total, Ur: 683 mcg/24 h (ref 224–832)
Metanephrines, Ur: 165 mcg/24 h (ref 90–315)
Normetanephrine, 24H Ur: 518 mcg/24 h (ref 122–676)
Volume, Urine-VMAUR: 1850 mL

## 2020-08-15 ENCOUNTER — Telehealth: Payer: Self-pay

## 2020-08-15 LAB — CATECHOLAMINES, FRACTIONATED, PLASMA
Dopamine: 31 pg/mL — ABNORMAL HIGH
Epinephrine: 40 pg/mL
Norepinephrine: 475 pg/mL
Total Catecholamines: 546 pg/mL

## 2020-08-15 LAB — ALDOSTERONE + RENIN ACTIVITY W/ RATIO
ALDO / PRA Ratio: 8.8 Ratio (ref 0.9–28.9)
Aldosterone: 3 ng/dL
Renin Activity: 0.34 ng/mL/h (ref 0.25–5.82)

## 2020-08-15 NOTE — Telephone Encounter (Signed)
Inbound call from patient requesting results. Advised patient per Dr Cordelia Pen notes results are normal. Advised patient if he had any further questions or concerns to give [place] a call back.

## 2020-08-27 ENCOUNTER — Ambulatory Visit: Payer: Federal, State, Local not specified - PPO | Admitting: Adult Health

## 2020-08-27 ENCOUNTER — Encounter: Payer: Self-pay | Admitting: Adult Health

## 2020-08-27 ENCOUNTER — Ambulatory Visit
Admission: RE | Admit: 2020-08-27 | Discharge: 2020-08-27 | Disposition: A | Discharge status: Home / Self Care | Payer: Federal, State, Local not specified - PPO | Source: Ambulatory Visit | Attending: Adult Health | Admitting: Adult Health

## 2020-08-27 ENCOUNTER — Other Ambulatory Visit
Admission: RE | Admit: 2020-08-27 | Discharge: 2020-08-27 | Disposition: A | Discharge status: Home / Self Care | Payer: Federal, State, Local not specified - PPO | Source: Home / Self Care | Attending: Adult Health | Admitting: Adult Health

## 2020-08-27 ENCOUNTER — Ambulatory Visit
Admission: RE | Admit: 2020-08-27 | Discharge: 2020-08-27 | Disposition: A | Discharge status: Home / Self Care | Payer: Federal, State, Local not specified - PPO | Source: Home / Self Care | Attending: Adult Health | Admitting: Adult Health

## 2020-08-27 ENCOUNTER — Other Ambulatory Visit: Payer: Self-pay

## 2020-08-27 VITALS — BP 132/80 | HR 77 | Temp 98.5°F | Resp 16 | Wt 274.2 lb

## 2020-08-27 DIAGNOSIS — S8991XA Unspecified injury of right lower leg, initial encounter: Secondary | ICD-10-CM | POA: Insufficient documentation

## 2020-08-27 DIAGNOSIS — M79604 Pain in right leg: Secondary | ICD-10-CM | POA: Insufficient documentation

## 2020-08-27 DIAGNOSIS — M7989 Other specified soft tissue disorders: Secondary | ICD-10-CM

## 2020-08-27 DIAGNOSIS — S79921A Unspecified injury of right thigh, initial encounter: Secondary | ICD-10-CM | POA: Diagnosis not present

## 2020-08-27 DIAGNOSIS — R600 Localized edema: Secondary | ICD-10-CM | POA: Diagnosis not present

## 2020-08-27 LAB — CBC WITH DIFFERENTIAL/PLATELET
Abs Immature Granulocytes: 0.06 10*3/uL (ref 0.00–0.07)
Basophils Absolute: 0.1 10*3/uL (ref 0.0–0.1)
Basophils Relative: 1 %
Eosinophils Absolute: 0.2 10*3/uL (ref 0.0–0.5)
Eosinophils Relative: 2 %
HCT: 38.8 % — ABNORMAL LOW (ref 39.0–52.0)
Hemoglobin: 12.8 g/dL — ABNORMAL LOW (ref 13.0–17.0)
Immature Granulocytes: 1 %
Lymphocytes Relative: 25 %
Lymphs Abs: 1.8 10*3/uL (ref 0.7–4.0)
MCH: 28 pg (ref 26.0–34.0)
MCHC: 33 g/dL (ref 30.0–36.0)
MCV: 84.9 fL (ref 80.0–100.0)
Monocytes Absolute: 0.6 10*3/uL (ref 0.1–1.0)
Monocytes Relative: 9 %
Neutro Abs: 4.6 10*3/uL (ref 1.7–7.7)
Neutrophils Relative %: 62 %
Platelets: 274 10*3/uL (ref 150–400)
RBC: 4.57 MIL/uL (ref 4.22–5.81)
RDW: 13.7 % (ref 11.5–15.5)
WBC: 7.3 10*3/uL (ref 4.0–10.5)
nRBC: 0 % (ref 0.0–0.2)

## 2020-08-27 LAB — COMPREHENSIVE METABOLIC PANEL
ALT: 38 U/L (ref 0–44)
AST: 32 U/L (ref 15–41)
Albumin: 3.8 g/dL (ref 3.5–5.0)
Alkaline Phosphatase: 77 U/L (ref 38–126)
Anion gap: 10 (ref 5–15)
BUN: 17 mg/dL (ref 6–20)
CO2: 23 mmol/L (ref 22–32)
Calcium: 9.1 mg/dL (ref 8.9–10.3)
Chloride: 105 mmol/L (ref 98–111)
Creatinine, Ser: 1.16 mg/dL (ref 0.61–1.24)
GFR, Estimated: >60 mL/min (ref >60)
Glucose, Bld: 136 mg/dL — ABNORMAL HIGH (ref 70–99)
Potassium: 4 mmol/L (ref 3.5–5.1)
Sodium: 138 mmol/L (ref 135–145)
Total Bilirubin: 0.4 mg/dL (ref 0.3–1.2)
Total Protein: 7.6 g/dL (ref 6.5–8.1)

## 2020-08-27 LAB — BRAIN NATRIURETIC PEPTIDE: B Natriuretic Peptide: 27.3 pg/mL (ref 0.0–100.0)

## 2020-08-27 NOTE — Progress Notes (Signed)
Established patient visit   Patient: Sean Bates   DOB: 03-Feb-1971   49 y.o. Male  MRN: [id number] Visit Date: 08/27/2020  Today's healthcare provider: Marcille Buffy, FNP   Chief Complaint  Patient presents with  . Edema   Subjective    HPI  Follow up for generalized edema  The patient was last seen for this 2 months ago. Changes made at last visit include labs ordered and referral was placed to cardiology, patient encouraged to continue Lasix. Patient states for the past week he has had pain and swelling mainly in his right leg, he states that he has tried otc Tylenol and otc Aspirin with no relief.  He reports fair compliance with treatment. Patient was discontinued off Lasix He feels that condition is Unchanged. He is not having side effects.   He was using a stump  Grinder and the handle hit him in the leg, he has had pain in lower leg. He has a bruise on his thigh that has resolved.   Patient  denies any fever, body aches,chills, rash, chest pain, shortness of breath, nausea, vomiting, or diarrhea.    -----------------------------------------------------------------------------------------      Medications: Outpatient Medications Prior to Visit  Medication Sig  . aspirin 325 MG tablet Take by mouth.  . Calcium Carbonate-Vitamin D 600-400 MG-UNIT tablet Take by mouth.  . dexamethasone (DECADRON) 1 MG tablet Take 1 tablet (1 mg total) by mouth See admin instructions. Take at 9-10 PM, the night before blood test  . Multiple Vitamin (MULTIVITAMIN) capsule Take 1 capsule by mouth daily.  . Potassium 75 MG TABS Take by mouth.  . Zinc Sulfate (ZINC 15 PO) Take by mouth.  . furosemide (LASIX) 40 MG tablet Take 1 tablet (40 mg total) by mouth daily. (Patient not taking: Reported on 08/27/2020)   No facility-administered medications prior to visit.    Review of Systems  Constitutional: Positive for fatigue. Negative for activity change, appetite change,  chills, diaphoresis, fever and unexpected weight change.  HENT: Positive for facial swelling.   Eyes: Negative.   Respiratory: Negative.   Cardiovascular: Positive for leg swelling (right lower leg ).  Gastrointestinal: Positive for abdominal distention. Negative for abdominal pain.       Colostomy bag   Genitourinary: Negative.   Musculoskeletal: Positive for arthralgias.    Last CBC Lab Results  Component Value Date   WBC 7.3 08/27/2020   HGB 12.8 (L) 08/27/2020   HCT 38.8 (L) 08/27/2020   MCV 84.9 08/27/2020   MCH 28.0 08/27/2020   RDW 13.7 08/27/2020   PLT 274 86/[birth year]/1950   Last metabolic panel Lab Results  Component Value Date   GLUCOSE 136 (H) 08/27/2020   NA 138 08/27/2020   K 4.0 08/27/2020   CL 105 08/27/2020   CO2 23 08/27/2020   BUN 17 08/27/2020   CREATININE 1.16 08/27/2020   GFRNONAA >60 08/27/2020   GFRAA 91 05/13/2020   CALCIUM 9.1 08/27/2020   PROT 7.6 08/27/2020   ALBUMIN 3.8 08/27/2020   LABGLOB 3.3 05/13/2020   AGRATIO 1.3 05/13/2020   BILITOT 0.4 08/27/2020   ALKPHOS 77 08/27/2020   AST 32 08/27/2020   ALT 38 08/27/2020   ANIONGAP 10 08/27/2020      Objective    BP 132/80   Pulse 77   Temp 98.5 F (36.9 C) (Oral)   Resp 16   Wt 274 lb 3.2 oz (124.4 kg)   SpO2 97%   BMI  32.52 kg/m  BP Readings from Last 3 Encounters:  08/27/20 132/80  08/01/20 138/74  06/26/20 106/64   Wt Readings from Last 3 Encounters:  08/27/20 274 lb 3.2 oz (124.4 kg)  08/01/20 267 lb (121.1 kg)  06/26/20 246 lb (111.6 kg)      Physical Exam Constitutional:      General: He is not in acute distress.    Comments: Continues to have generalized edema of face and abdomen.  New swelling to right lower leg. No warmth. No weeping. No erythema.   HENT:     Head: Normocephalic and atraumatic.     Right Ear: Tympanic membrane, ear canal and external ear normal. There is no impacted cerumen.     Left Ear: Tympanic membrane, ear canal and external ear normal.  There is no impacted cerumen.     Nose: Nose normal. No congestion or rhinorrhea.     Mouth/Throat:     Mouth: Mucous membranes are moist.     Pharynx: No oropharyngeal exudate or posterior oropharyngeal erythema.  Eyes:     General: No scleral icterus.       Right eye: No discharge.        Left eye: No discharge.     Extraocular Movements: Extraocular movements intact.     Conjunctiva/sclera: Conjunctivae normal.     Pupils: Pupils are equal, round, and reactive to light.  Cardiovascular:     Rate and Rhythm: Normal rate and regular rhythm.     Pulses: Normal pulses.     Heart sounds: Normal heart sounds.  Pulmonary:     Effort: Pulmonary effort is normal. No respiratory distress.     Breath sounds: Normal breath sounds. No stridor. No wheezing, rhonchi or rales.  Chest:     Chest wall: No tenderness.  Abdominal:     General: There is distension.     Palpations: Abdomen is soft.  Musculoskeletal:        General: Swelling present.     Cervical back: Normal range of motion and neck supple.     Right lower leg: Edema present.  Skin:    General: Skin is warm.     Capillary Refill: Capillary refill takes less than 2 seconds.     Findings: No erythema or rash.  Neurological:     Mental Status: He is oriented to person, place, and time.  Psychiatric:        Mood and Affect: Mood normal.        Behavior: Behavior normal.        Thought Content: Thought content normal.        Judgment: Judgment normal.       Results for orders placed or performed during the hospital encounter of 08/27/20  CBC with Differential/Platelet  Result Value Ref Range   WBC 7.3 4.0 - 10.5 K/uL   RBC 4.57 4.22 - 5.81 MIL/uL   Hemoglobin 12.8 (L) 13.0 - 17.0 g/dL   HCT 38.8 (L) 39.0 - 52.0 %   MCV 84.9 80.0 - 100.0 fL   MCH 28.0 26.0 - 34.0 pg   MCHC 33.0 30.0 - 36.0 g/dL   RDW 13.7 11.5 - 15.5 %   Platelets 274 150 - 400 K/uL   nRBC 0.0 0.0 - 0.2 %   Neutrophils Relative % 62 %   Neutro Abs 4.6  1.7 - 7.7 K/uL   Lymphocytes Relative 25 %   Lymphs Abs 1.8 0.7 - 4.0 K/uL   Monocytes Relative 9 %  Monocytes Absolute 0.6 0.1 - 1.0 K/uL   Eosinophils Relative 2 %   Eosinophils Absolute 0.2 0.0 - 0.5 K/uL   Basophils Relative 1 %   Basophils Absolute 0.1 0.0 - 0.1 K/uL   Immature Granulocytes 1 %   Abs Immature Granulocytes 0.06 0.00 - 0.07 K/uL  Comprehensive metabolic panel  Result Value Ref Range   Sodium 138 135 - 145 mmol/L   Potassium 4.0 3.5 - 5.1 mmol/L   Chloride 105 98 - 111 mmol/L   CO2 23 22 - 32 mmol/L   Glucose, Bld 136 (H) 70 - 99 mg/dL   BUN 17 6 - 20 mg/dL   Creatinine, Ser 1.16 0.61 - 1.24 mg/dL   Calcium 9.1 8.9 - 10.3 mg/dL   Total Protein 7.6 6.5 - 8.1 g/dL   Albumin 3.8 3.5 - 5.0 g/dL   AST 32 15 - 41 U/L   ALT 38 0 - 44 U/L   Alkaline Phosphatase 77 38 - 126 U/L   Total Bilirubin 0.4 0.3 - 1.2 mg/dL   GFR, Estimated >60 >60 mL/min   Anion gap 10 5 - 15  Brain natriuretic peptide  Result Value Ref Range   B Natriuretic Peptide 27.3 0.0 - 100.0 pg/mL    Assessment & Plan    Right leg pain - Plan: US Venous Img Lower Unilateral Right (DVT), CBC with Differential/Platelet, Comprehensive Metabolic Panel (CMET), B Nat Peptide, DG FEMUR, MIN 2 VIEWS RIGHT, DG Tibia/Fibula Right  Right leg swelling - Plan: US Venous Img Lower Unilateral Right (DVT), CBC with Differential/Platelet, Comprehensive Metabolic Panel (CMET), B Nat Peptide, DG FEMUR, MIN 2 VIEWS RIGHT, DG Tibia/Fibula Right  Injury of right lower extremity, initial encounter - Plan: US Venous Img Lower Unilateral Right (DVT), CBC with Differential/Platelet, Comprehensive Metabolic Panel (CMET), DG FEMUR, MIN 2 VIEWS RIGHT, DG Tibia/Fibula Right  Orders Placed This Encounter  Procedures  . US Venous Img Lower Unilateral Right (DVT)    Order Specific Question:   Reason for Exam (SYMPTOM  OR DIAGNOSIS REQUIRED)    Answer:   right leg swelling and pain    Order Specific Question:   Preferred  imaging location?    Answer:   [facility]    Order Specific Question:   Call Results- Best Contact Number?    Answer:   [phone] / hold patient  . DG FEMUR, MIN 2 VIEWS RIGHT    Order Specific Question:   Reason for Exam (SYMPTOM  OR DIAGNOSIS REQUIRED)    Answer:   injury to thigh was hit with object    Order Specific Question:   Preferred imaging location?    Answer:   [facility]  . DG Tibia/Fibula Right    Order Specific Question:   Reason for Exam (SYMPTOM  OR DIAGNOSIS REQUIRED)    Answer:   pain and swelling in leg.    Order Specific Question:   Preferred imaging location?    Answer:   [facility]  . CBC with Differential/Platelet  . Comprehensive Metabolic Panel (CMET)  . B Nat Peptide    Keep follow up with oncology, gastrointestinal MD and endocrinology/ CT scan adrenals order in place by oncology.  Return in about 1 week (around 09/03/2020), or if symptoms worsen or fail to improve, for at any time for any worsening symptoms, Go to Emergency room/ urgent care if worse.      The entirety of the information documented in the History of Present Illness, Review of Systems  and Physical Exam were personally obtained by me. Portions of this information were initially documented by the CMA and reviewed by me for thoroughness and accuracy.   Addressed acute and or chronic medical problems today requiring 45 minutes reviewing patients medical record,labs, counseling patient regarding patient's conditions, any medications, answering questions regarding health, and coordination of care as needed. After visit summary patient given copy and reviewed The primary encounter diagnosis was Right leg pain. Diagnoses of Right leg swelling and Injury of right lower extremity, initial encounter were also pertinent to this visit.    Marcille Buffy, [place] [phone] (phone) [phone] (fax)  [place]

## 2020-08-27 NOTE — Progress Notes (Signed)
Negative right leg lower x ray, negative  upper leg x ray, and negative for DVT clot in right leg.  Advise over the counter pain relievers and if persist go to [facility] and keep specialty appointments. Would advise a referral to internal medicine if he is willing to be seen in the future.

## 2020-08-27 NOTE — Progress Notes (Signed)
Negative right leg lower x ray, negative upper leg x ray, and negative for DVT clot in right leg.  Advise over the counter pain relievers and if persist go to [facility] and keep specialty appointments. Would advise a referral to internal medicine if he is willing to be seen in the future.

## 2020-08-27 NOTE — Patient Instructions (Signed)
Rule out with ultrasound Deep Vein Thrombosis  Deep vein thrombosis (DVT) is a condition in which a blood clot forms in a deep vein, such as a lower leg, thigh, or arm vein. A clot is blood that has thickened into a gel or solid. This condition is dangerous. It can lead to serious and even life-threatening complications if the clot travels to the lungs and causes a blockage (pulmonary embolism). It can also damage veins in the leg. This can result in leg pain, swelling, discoloration, and sores (post-thrombotic syndrome). What are the causes? This condition may be caused by:  A slowdown of blood flow.  Damage to a vein.  A condition that causes blood to clot more easily, such as an inherited clotting disorder. What increases the risk? The following factors may make you more likely to develop this condition:  Being overweight.  Being older, especially over age 69.  Sitting or lying down for more than four hours.  Being in the hospital.  Lack of physical activity (sedentary lifestyle).  Pregnancy, being in childbirth, or having recently given birth.  Taking medicines that contain estrogen, such as medicines to prevent pregnancy.  Smoking.  A history of any of the following: ? Blood clots or a blood clotting disease. ? Peripheral vascular disease. ? Inflammatory bowel disease. ? Cancer. ? Heart disease. ? Genetic conditions that affect how your blood clots, such as Factor V Leiden mutation. ? Neurological diseases that affect your legs (leg paresis). ? A recent injury, such as a car accident. ? Major or lengthy surgery. ? A central line placed inside a large vein. What are the signs or symptoms? Symptoms of this condition include:  Swelling, pain, or tenderness in an arm or leg.  Warmth, redness, or discoloration in an arm or leg. If the clot is in your leg, symptoms may be more noticeable or worse when you stand or walk. Some people may not develop any symptoms. How is  this diagnosed? This condition is diagnosed with:  A medical history and physical exam.  Tests, such as: ? Blood tests. These are done to check how well your blood clots. ? Ultrasound. This is done to check for clots. ? Venogram. For this test, contrast dye is injected into a vein and X-rays are taken to check for any clots. How is this treated? Treatment for this condition depends on:  The cause of your DVT.  Your risk for bleeding or developing more clots.  Any other medical conditions that you have. Treatment may include:  Taking a blood thinner (anticoagulant). This type of medicine prevents clots from forming. It may be taken by mouth, injected under the skin, or injected through an IV (catheter).  Injecting clot-dissolving medicines into the affected vein (catheter-directed thrombolysis).  Having surgery. Surgery may be done to: ? Remove the clot. ? Place a filter in a large vein to catch blood clots before they reach the lungs. Some treatments may be continued for up to six months. Follow these instructions at home: If you are taking blood thinners:  Take the medicine exactly as told by your health care provider. Some blood thinners need to be taken at the same time every day. Do not skip a dose.  Talk with your health care provider before you take any medicines that contain aspirin or NSAIDs. These medicines increase your risk for dangerous bleeding.  Ask your health care provider about foods and drugs that could change the way the medicine works (may interact). Avoid  those things if your health care provider tells you to do so.  Blood thinners can cause easy bruising and may make it difficult to stop bleeding. Because of this: ? Be very careful when using knives, scissors, or other sharp objects. ? Use an electric razor instead of a blade. ? Avoid activities that could cause injury or bruising, and follow instructions about how to prevent falls.  Wear a medical alert  bracelet or carry a card that lists what medicines you take. General instructions  Take over-the-counter and prescription medicines only as told by your health care provider.  Return to your normal activities as told by your health care provider. Ask your health care provider what activities are safe for you.  Wear compression stockings if recommended by your health care provider.  Keep all follow-up visits as told by your health care provider. This is important. How is this prevented? To lower your risk of developing this condition again:  For 30 or more minutes every day, do an activity that: ? Involves moving your arms and legs. ? Increases your heart rate.  When traveling for longer than four hours: ? Exercise your arms and legs every hour. ? Drink plenty of water. ? Avoid drinking alcohol.  Avoid sitting or lying for a long time without moving your legs.  If you have surgery or you are hospitalized, ask about ways to prevent blood clots. These may include taking frequent walks or using anticoagulants.  Stay at a healthy weight.  If you are a woman who is older than age 66, avoid unnecessary use of medicines that contain estrogen, such as some birth control pills.  Do not use any products that contain nicotine or tobacco, such as cigarettes and e-cigarettes. This is especially important if you take estrogen medicines. If you need help quitting, ask your health care provider. Contact a health care provider if:  You miss a dose of your blood thinner.  Your menstrual period is heavier than usual.  You have unusual bruising. Get help right away if:  You have: ? New or increased pain, swelling, or redness in an arm or leg. ? Numbness or tingling in an arm or leg. ? Shortness of breath. ? Chest pain. ? A rapid or irregular heartbeat. ? A severe headache or confusion. ? A cut that will not stop bleeding.  There is blood in your vomit, stool, or urine.  You have a  serious fall or accident, or you hit your head.  You feel light-headed or dizzy.  You cough up blood. These symptoms may represent a serious problem that is an emergency. Do not wait to see if the symptoms will go away. Get medical help right away. Call your local emergency services (911 in the [place] Do not drive yourself to the hospital. Summary  Deep vein thrombosis (DVT) is a condition in which a blood clot forms in a deep vein, such as a lower leg, thigh, or arm vein.  Symptoms can include swelling, warmth, pain, and redness in your leg or arm.  This condition may be treated with a blood thinner (anticoagulant medicine), medicine that is injected to dissolve blood clots,compression stockings, or surgery.  If you are prescribed blood thinners, take them exactly as told. This information is not intended to replace advice given to you by your health care provider. Make sure you discuss any questions you have with your health care provider. Document Revised: 08/13/2017 Document Reviewed: 01/29/2017 Elsevier Patient Education  [place].  Leg Cramps Leg cramps occur when one or more muscles tighten and you have no control over this tightening (involuntary muscle contraction). Muscle cramps can develop in any muscle, but the most common place is in the calf muscles of the leg. Those cramps can occur during exercise or when you are at rest. Leg cramps are painful, and they may last for a few seconds to a few minutes. Cramps may return several times before they finally stop. Usually, leg cramps are not caused by a serious medical problem. In many cases, the cause is not known. Some common causes include:  Excessive physical effort (overexertion), such as during intense exercise.  Overuse from repetitive motions, or doing the same thing over and over.  Staying in a certain position for a long period of time.  Improper preparation, form, or technique while performing a sport or an  activity.  Dehydration.  Injury.  Side effects of certain medicines.  Abnormally low levels of minerals in your blood (electrolytes), especially potassium and calcium. This could result from: ? Pregnancy. ? Taking diuretic medicines. Follow these instructions at home: Eating and drinking  Drink enough fluid to keep your urine pale yellow. Staying hydrated may help prevent cramps.  Eat a healthy diet that includes plenty of nutrients to help your muscles function. A healthy diet includes fruits and vegetables, lean protein, whole grains, and low-fat or nonfat dairy products. Managing pain, stiffness, and swelling      Try massaging, stretching, and relaxing the affected muscle. Do this for several minutes at a time.  If directed, put ice on areas that are sore or painful after a cramp: ? Put ice in a plastic bag. ? Place a towel between your skin and the bag. ? Leave the ice on for 20 minutes, 2-3 times a day.  If directed, apply heat to muscles that are tense or tight. Do this before you exercise, or as often as told by your health care provider. Use the heat source that your health care provider recommends, such as a moist heat pack or a heating pad. ? Place a towel between your skin and the heat source. ? Leave the heat on for 20-30 minutes. ? Remove the heat if your skin turns bright red. This is especially important if you are unable to feel pain, heat, or cold. You may have a greater risk of getting burned.  Try taking hot showers or baths to help relax tight muscles. General instructions  If you are having frequent leg cramps, avoid intense exercise for several days.  Take over-the-counter and prescription medicines only as told by your health care provider.  Keep all follow-up visits as told by your health care provider. This is important. Contact a health care provider if:  Your leg cramps get more severe or more frequent, or they do not improve over time.  Your  foot becomes cold, numb, or blue. Summary  Muscle cramps can develop in any muscle, but the most common place is in the calf muscles of the leg.  Leg cramps are painful, and they may last for a few seconds to a few minutes.  Usually, leg cramps are not caused by a serious medical problem. Often, the cause is not known.  Stay hydrated and take over-the-counter and prescription medicines only as told by your health care provider. This information is not intended to replace advice given to you by your health care provider. Make sure you discuss any questions you have with your health  care provider. Document Revised: 08/13/2017 Document Reviewed: 06/10/2017 Elsevier Patient Education  2020 [employer].

## 2020-08-27 NOTE — Progress Notes (Signed)
NO DVT ON Right lower extremity. X rays are pending.

## 2020-08-27 NOTE — Progress Notes (Signed)
BNP within normal limits, CBC - hemoglobin is lower, any bleeding ? Please add on iron tibc and ferritin levels.  CMP glucose elevated, not fasting.  Should schedule adrenal CT and follow up with Oncology. Still recommend seeing gastrointestinal MD.

## 2020-09-02 ENCOUNTER — Other Ambulatory Visit: Payer: Self-pay | Admitting: Adult Health

## 2020-09-03 ENCOUNTER — Other Ambulatory Visit: Payer: Self-pay | Admitting: Adult Health

## 2020-09-03 DIAGNOSIS — D649 Anemia, unspecified: Secondary | ICD-10-CM

## 2020-09-03 NOTE — Progress Notes (Signed)
Iron

## 2021-02-03 ENCOUNTER — Telehealth: Payer: Self-pay

## 2021-02-03 NOTE — Telephone Encounter (Signed)
Copied from [place] [id number]. Topic: Appointment Scheduling - Scheduling Inquiry for Clinic >> Feb 03, 2021 10:33 AM Lennox Solders wrote: Reason for CRM:  Pt does not want to do virtual appt tomorrow. Pt is having cough and coughing up yellow mucus . Pt states he has sinus infection that has went to his chest.

## 2021-02-04 NOTE — Telephone Encounter (Signed)
No, he cannot be seen in office. He is welcome to make a virtual appointment with Sharyn Lull at [facility] by calling [phone]

## 2021-02-04 NOTE — Telephone Encounter (Signed)
Spoke with patient on the phone who reports that he has had productive cough for one week and fever for 24hrs which has since resolved, he states that he is scheduled for a virtual appointment tomorrow but states that he only wants to be seen in person and insist that he has a sinus infection. Please advise if appropriate for in person evaluation? KW

## 2021-02-07 ENCOUNTER — Telehealth: Payer: Federal, State, Local not specified - PPO | Admitting: Family Medicine

## 2021-02-07 ENCOUNTER — Encounter: Payer: Self-pay | Admitting: Family Medicine

## 2021-02-07 DIAGNOSIS — J40 Bronchitis, not specified as acute or chronic: Secondary | ICD-10-CM

## 2021-02-07 DIAGNOSIS — J0140 Acute pansinusitis, unspecified: Secondary | ICD-10-CM

## 2021-02-07 MED ORDER — ALBUTEROL SULFATE HFA 108 (90 BASE) MCG/ACT IN AERS
2.0000 | INHALATION_SPRAY | Freq: Four times a day (QID) | RESPIRATORY_TRACT | 2 refills | Status: DC | PRN
Start: 2021-02-07 — End: 2022-02-11

## 2021-02-07 MED ORDER — BENZONATATE 100 MG PO CAPS
100.0000 mg | ORAL_CAPSULE | Freq: Two times a day (BID) | ORAL | 0 refills | Status: DC | PRN
Start: 2021-02-07 — End: 2022-02-11

## 2021-02-07 MED ORDER — AZITHROMYCIN 250 MG PO TABS: ORAL_TABLET | ORAL | 0 refills | Status: AC

## 2021-02-07 NOTE — Progress Notes (Signed)
Virtual telephone visit    Virtual Visit via Telephone Note   This visit type was conducted due to national recommendations for restrictions regarding the COVID-19 Pandemic (e.g. social distancing) in an effort to limit this patient's exposure and mitigate transmission in our community. Due to his co-morbid illnesses, this patient is at least at moderate risk for complications without adequate follow up. This format is felt to be most appropriate for this patient at this time. The patient did not have access to video technology or had technical difficulties with video requiring transitioning to audio format only (telephone). Physical exam was limited to content and character of the telephone converstion.    Patient location: work Provider location: office  I discussed the limitations of evaluation and management by telemedicine and the availability of in person appointments. The patient expressed understanding and agreed to proceed.   Visit Date: 02/07/2021  Today's healthcare provider: Vernie Murders, PA-C   No chief complaint on file.  Subjective    Sinusitis This is a new problem. The current episode started in the past 7 days. The problem has been gradually worsening since onset. There has been no fever. Associated symptoms include congestion, coughing, ear pain, headaches, sinus pressure and a sore throat. Pertinent negatives include no chills, diaphoresis, shortness of breath or sneezing. Past treatments include oral decongestants. The treatment provided no relief.        Patient Active Problem List   Diagnosis Date Noted  . Right leg pain 08/27/2020  . Right leg swelling 08/27/2020  . Injury of right leg 08/27/2020  . Cancer of left colon ([place]) 06/26/2020  . Rectal cancer ([place]) 06/26/2020  . Adrenal nodule ([place]) 04/03/2020  . Weight gain 03/22/2020  . Generalized edema 03/22/2020  . Other ascites 03/22/2020  . Encounter for assessment of ascites 03/22/2020  .  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 History of colon cancer 03/11/2015  . H/O malignant neoplasm of rectum 09/20/2014  . 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 Familial polyposis 09/20/2014  . Current tobacco use 11/16/2006   Past Medical History:  Diagnosis Date  . Cancer [facility])    history of colon  . Malignant neoplasm of colon ([place]) 03/22/2020   Past Surgical History:  Procedure Laterality Date  . COLON SURGERY  2006   Family History  Problem Relation Age of Onset  . Asthma Maternal Grandmother   . Cancer Paternal Grandmother   . Healthy Mother   . Diabetes Father   . Cancer Paternal Aunt   . Adrenal disorder Neg Hx     Social History   Tobacco Use  . Smoking status: Former Smoker    Packs/day: 1.00    Years: 22.00    Pack years: 22.00    Types: Cigarettes  . Smokeless tobacco: Former Systems developer    Quit date: 09/13/1986  Substance Use Topics  . Alcohol use: Yes    Alcohol/week: 0.0 standard drinks    Comment: occasional  . Drug use: No   No Known Allergies  Medications: Outpatient Medications Prior to Visit  Medication Sig  . aspirin 325 MG tablet Take by mouth.  . Calcium Carbonate-Vitamin D 600-400 MG-UNIT tablet Take by mouth.  . Multiple Vitamin (MULTIVITAMIN) capsule Take 1 capsule by mouth daily.  . Potassium 75 MG TABS Take by mouth.  . Zinc Sulfate (ZINC 15 PO) Take by mouth.  . dexamethasone (DECADRON) 1 MG tablet Take 1 tablet (1 mg total) by mouth See admin instructions. Take  at 9-10 PM, the night before blood test (Patient not taking: Reported on 02/07/2021)  . furosemide (LASIX) 40 MG tablet Take 1 tablet (40 mg total) by mouth daily. (Patient not taking: No sig reported)   No facility-administered medications prior to visit.    Review of Systems  Constitutional: Positive for fatigue. Negative for activity change, appetite change, chills, diaphoresis, fever and unexpected weight change.  HENT: Positive for congestion, ear pain, sinus  pressure, sinus pain, sore throat and tinnitus. Negative for ear discharge, postnasal drip, rhinorrhea and sneezing.   Respiratory: Positive for cough and wheezing. Negative for apnea, choking, chest tightness, shortness of breath and stridor.   Gastrointestinal: Negative.   Neurological: Positive for headaches. Negative for dizziness and light-headedness.      Objective    There were no vitals taken for this visit.  During telephone interview, no apparent respiratory distress noted.   Assessment & Plan     1. Subacute pansinusitis Developed sinus congestion, stopped up ears, PND, headache around eyes and some cough the past 7 days. Had one day of temperature up to 100, none since that time. No much relief from Robitussin, Tylenol, etc. Recommend Flonase, Mucinex, and add antibiotic with Tessalon for cough. Recommend he monitor for COVID symptoms and test if no better in the next 3 days. - benzonatate (TESSALON) 100 MG capsule; Take 1 capsule (100 mg total) by mouth 2 (two) times daily as needed for cough.  Dispense: 20 capsule; Refill: 0 - azithromycin (ZITHROMAX) 250 MG tablet; Take 2 tablets on day 1, then 1 tablet daily on days 2 through 5  Dispense: 6 tablet; Refill: 0  2. Bronchitis Some cough and occasional wheeze with sinus congestion and PND. Recommend he monitor for COVID symptoms and test if fever returns in the next 2-3 days. - albuterol (VENTOLIN HFA) 108 (90 Base) MCG/ACT inhaler; Inhale 2 puffs into the lungs every 6 (six) hours as needed for wheezing or shortness of breath.  Dispense: 8 g; Refill: 2   No follow-ups on file.    I discussed the assessment and treatment plan with the patient. The patient was provided an opportunity to ask questions and all were answered. The patient agreed with the plan and demonstrated an understanding of the instructions.   The patient was advised to call back or seek an in-person evaluation if the symptoms worsen or if the condition  fails to improve as anticipated.  I provided 20 minutes of non-face-to-face time during this encounter.  I, Sandeep Delagarza, PA-C, have reviewed all documentation for this visit. The documentation on 02/07/21 for the exam, diagnosis, procedures, and orders are all accurate and complete.   Vernie Murders, PA-C [employer] [phone] (phone) [phone] (fax)  [place]

## 2021-09-09 DIAGNOSIS — E278 Other specified disorders of adrenal gland: Secondary | ICD-10-CM | POA: Diagnosis not present

## 2021-09-09 DIAGNOSIS — Z125 Encounter for screening for malignant neoplasm of prostate: Secondary | ICD-10-CM | POA: Diagnosis not present

## 2021-09-09 DIAGNOSIS — Z13220 Encounter for screening for lipoid disorders: Secondary | ICD-10-CM | POA: Diagnosis not present

## 2021-09-09 DIAGNOSIS — Z933 Colostomy status: Secondary | ICD-10-CM | POA: Diagnosis not present

## 2021-09-09 DIAGNOSIS — M2550 Pain in unspecified joint: Secondary | ICD-10-CM | POA: Diagnosis not present

## 2021-09-09 DIAGNOSIS — C186 Malignant neoplasm of descending colon: Secondary | ICD-10-CM | POA: Diagnosis not present

## 2021-09-09 DIAGNOSIS — Z85048 Personal history of other malignant neoplasm of rectum, rectosigmoid junction, and anus: Secondary | ICD-10-CM | POA: Diagnosis not present

## 2021-09-09 DIAGNOSIS — Z0000 Encounter for general adult medical examination without abnormal findings: Secondary | ICD-10-CM | POA: Diagnosis not present

## 2021-11-21 IMAGING — CT CT ABD-PELV W/ CM
2 of 9 series · 8 of 46 positions shown, 14 images · IV contrast (omnipaque)
Comparison: CT abdomen and pelvis 06/11/2005

CLINICAL DATA: Colorectal cancer post resection and colostomy, foul
drainage and odor from rectal bed, increased swelling in abdomen in
legs

EXAM:
CT CHEST, ABDOMEN, AND PELVIS WITH CONTRAST
TECHNIQUE: Multidetector CT imaging of the chest, abdomen and pelvis was
performed following the standard protocol during bolus
administration of intravenous contrast. Sagittal and coronal MPR
images reconstructed from axial data set.
CONTRAST:  100mL OMNIPAQUE IOHEXOL 300 MG/ML SOLN IV. Dilute oral
contrast.

[Series 2: axials cap 5.00 · axial · 0.98mm/px · z∈[-1498,-978]mm · 5 of 158 slices shown, 10 images]
[im 27/158  soft-tissue]
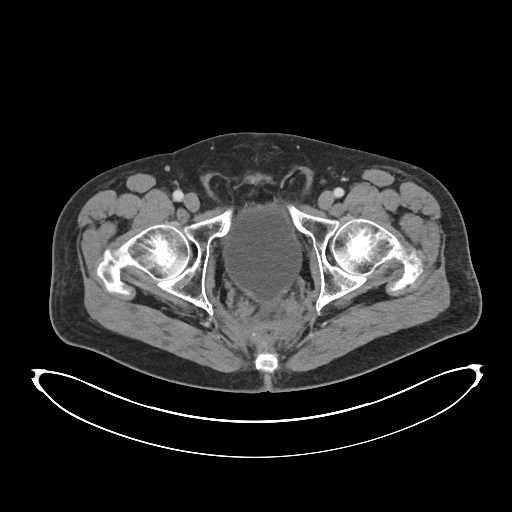
[im 27/158  bone]
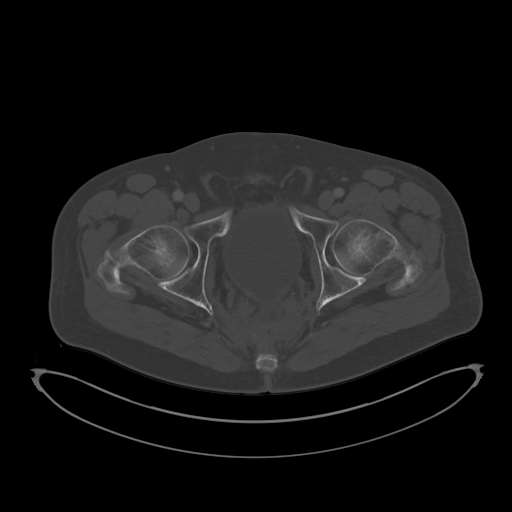
[im 53/158  soft-tissue]
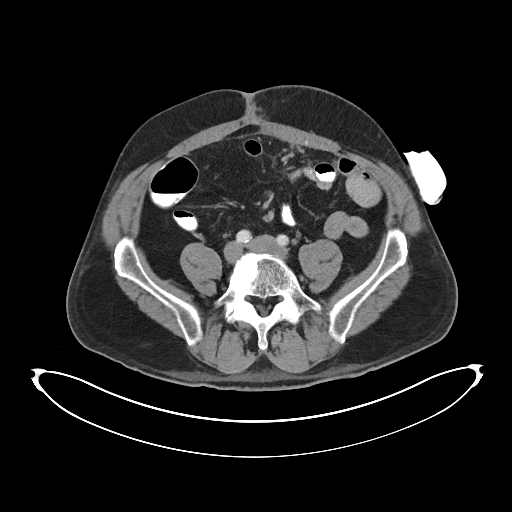
[im 53/158  lung]
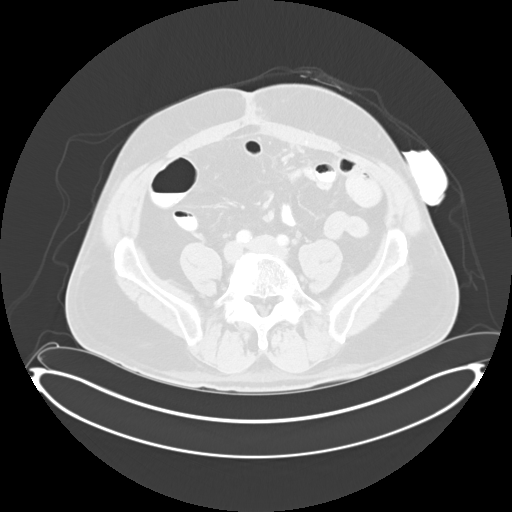
[im 79/158  soft-tissue]
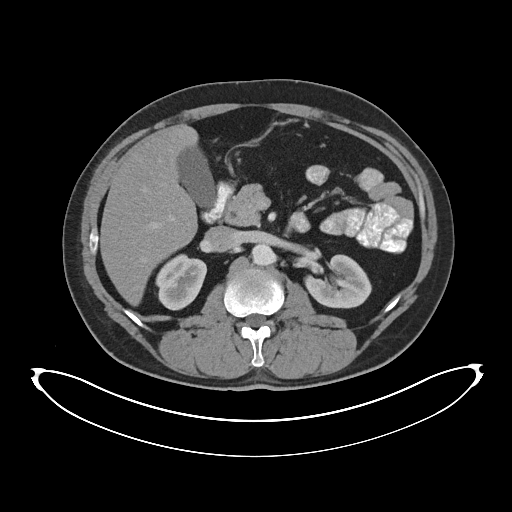
[im 79/158  lung]
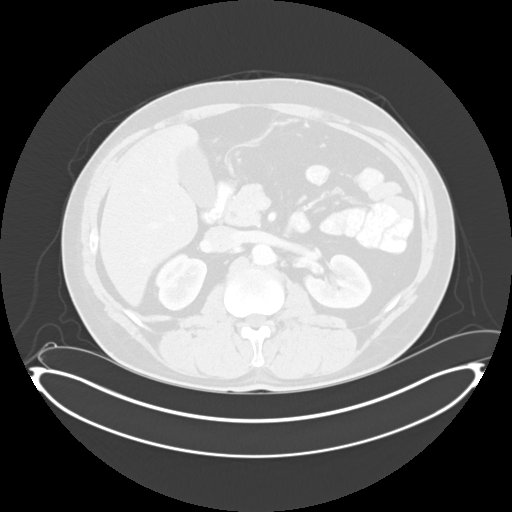
[im 105/158  soft-tissue]
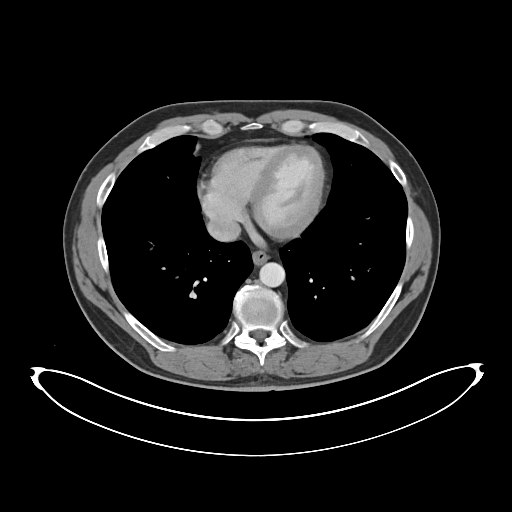
[im 105/158  lung]
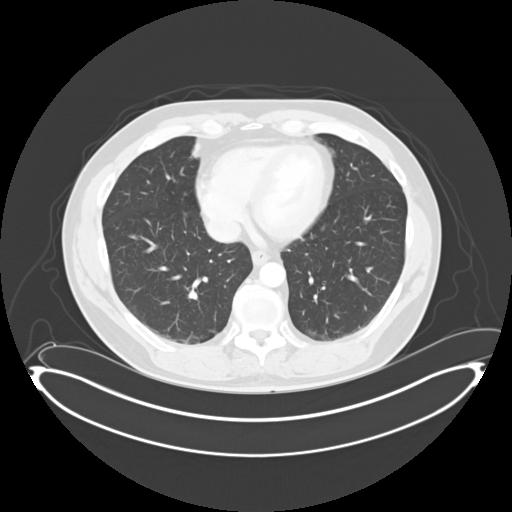
[im 131/158  soft-tissue]
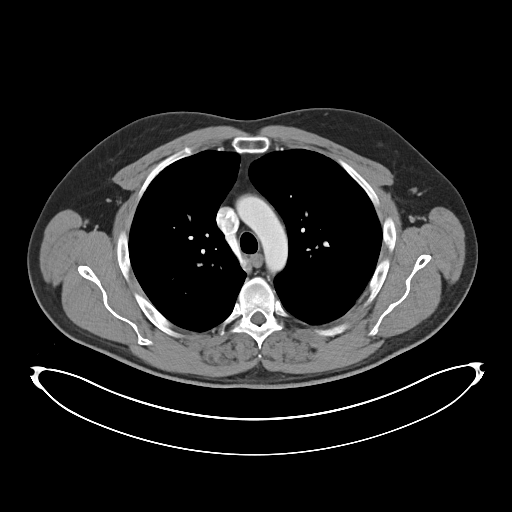
[im 131/158  lung]
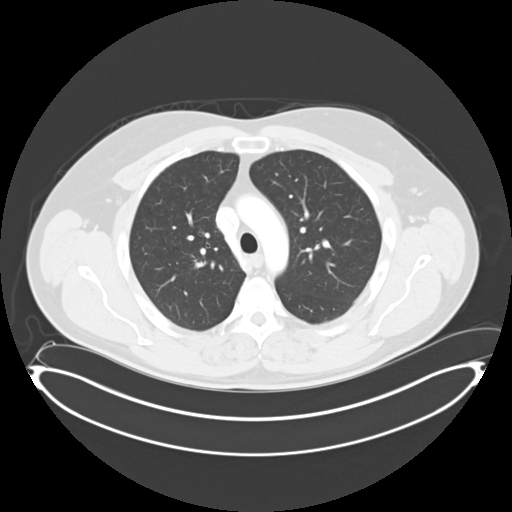

[Series 4: coronals cap 2.00 cor · coronal · 0.93mm/px · 3 of 159 slices shown, 4 images]
[im 40/159  soft-tissue]
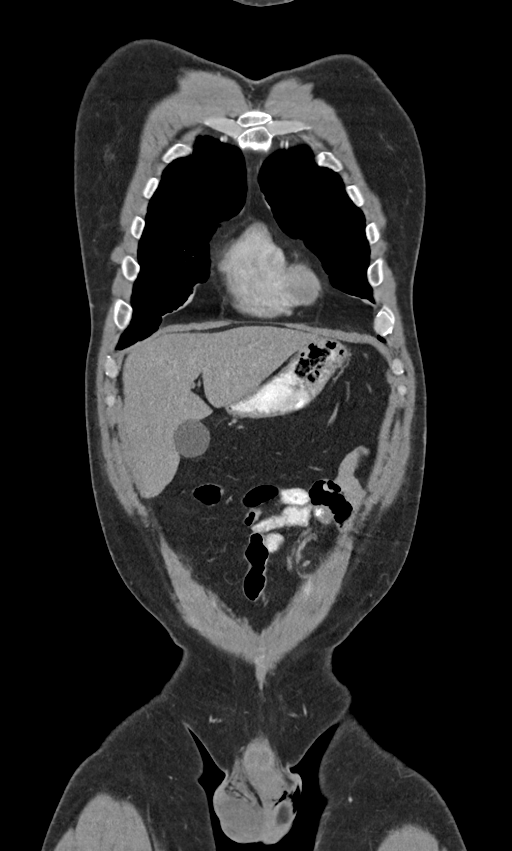
[im 80/159  soft-tissue]
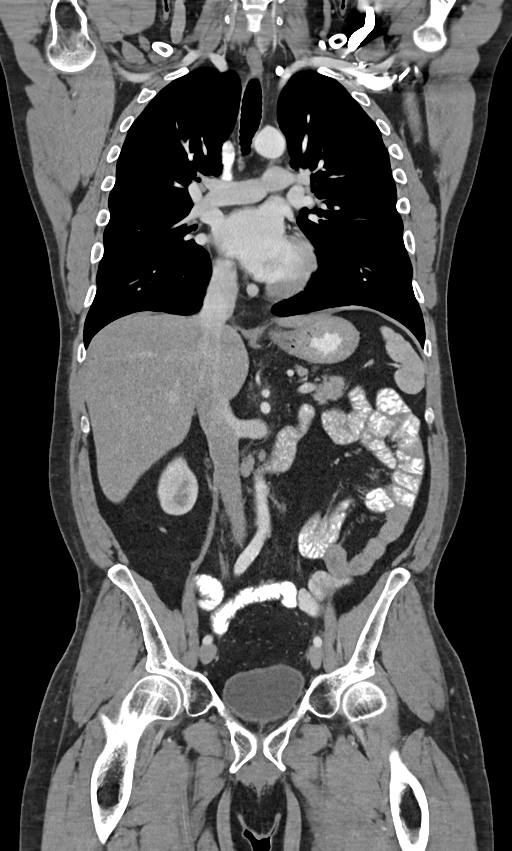
[im 80/159  bone]
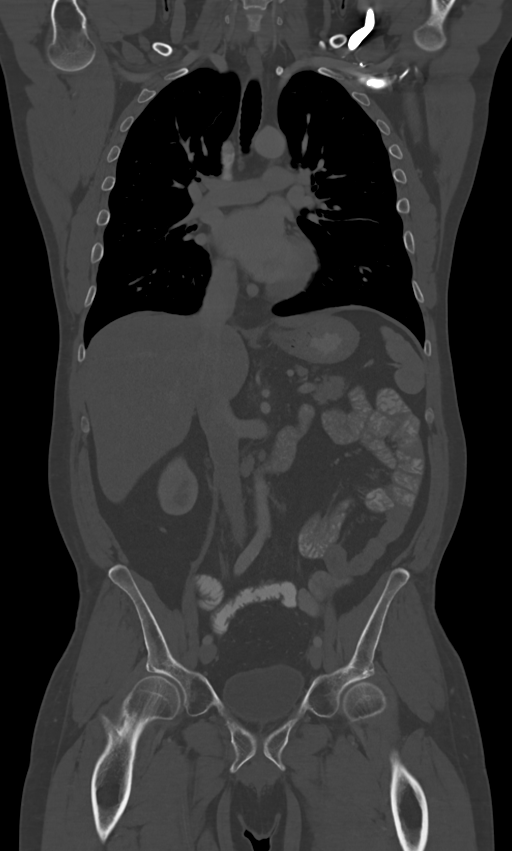
[im 119/159  soft-tissue]
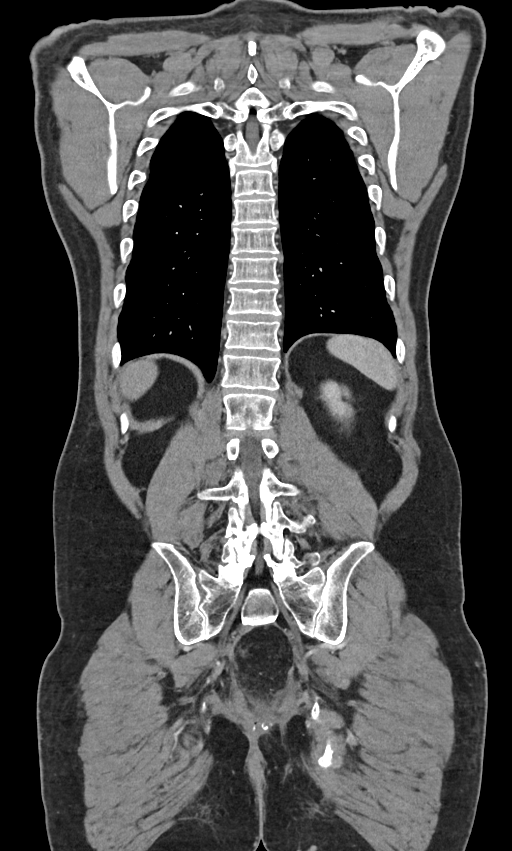

[8 of 46 positions shown; findings below may reference images not displayed]

FINDINGS: CT CHEST FINDINGS

Cardiovascular: Aorta normal caliber. Mild coronary arterial
calcifications. Heart normal size. No pericardial effusion.

Mediastinum/Nodes: Base of cervical region normal appearance.
Esophagus unremarkable. No thoracic adenopathy.

Lungs/Pleura: Lungs clear. No pulmonary infiltrate, pleural
effusion, pneumothorax or mass.

Musculoskeletal: No acute osseous abnormalities. Incidentally noted
subcutaneous cyst anterior abdominal wall RIGHT of midline 17 x 12
mm, favor sebaceous versus epidermal inclusion cyst.

CT ABDOMEN PELVIS FINDINGS

Hepatobiliary: Gallbladder and liver normal appearance

Pancreas: Normal appearance

Spleen: Normal appearance

Adrenals/Urinary Tract: Tiny LEFT adrenal nodule 10 x 8 mm image 70,
indeterminate attenuation and washout. RIGHT adrenal gland, kidneys,
ureters, and bladder normal appearance

Stomach/Bowel: Prior abdominal perineal resection with sigmoid
colostomy in LEFT mid abdomen. Prior ileocolic resection. Stomach
and bowel loops otherwise unremarkable.

Vascular/Lymphatic: Atherosclerotic calcification at aortic
bifurcation and iliac arteries. Aorta normal caliber. Circumaortic
LEFT renal vein. No adenopathy.

Reproductive: Unremarkable prostate gland

Other: Diffuse stranding in the presacral space and rectal bed
consistent with prior resection. An area of ill-defined
low-attenuation seen within rectal bed, 18 x 11 mm image [DATE]
represent a postoperative collection or tiny abscess. Single small
focus of associated gas. Small BILATERAL inguinal hernias containing
fat. Ventral abdominal surgical scar. No free air or free fluid.

Musculoskeletal: Degenerative disc disease changes L4-L5. No
additional osseous lesions.
IMPRESSION: Prior abdominal perineal resection with sigmoid colostomy in LEFT
mid abdomen.

Diffuse stranding in the presacral space and rectal bed consistent
with prior resection.

Ill-defined low-attenuation within the rectal bed 18 x 11 mm with a
single focus of gas question postoperative collection or tiny
abscess.

Small BILATERAL inguinal hernias containing fat.

No acute intrathoracic abnormalities.

Tiny LEFT adrenal nodule 10 x 8 mm, indeterminate attenuation and
washout; in light of history of colorectal cancer, recommend
characterization by adrenal CT.

Aortic Atherosclerosis (2M881-N7A.A).

## 2021-11-21 IMAGING — CR DG CHEST 2V
1 series · 2 of 2 positions shown · non-contrast
Comparison: June 02, 2013

CLINICAL DATA: History of malignant fluid weight gain. Assess for
congestive heart failure.

EXAM:
CHEST - 2 VIEW

[Series 1: dg chest 2 view · 0.14mm/px · 2 of 2 slices shown]
[im 1/2]
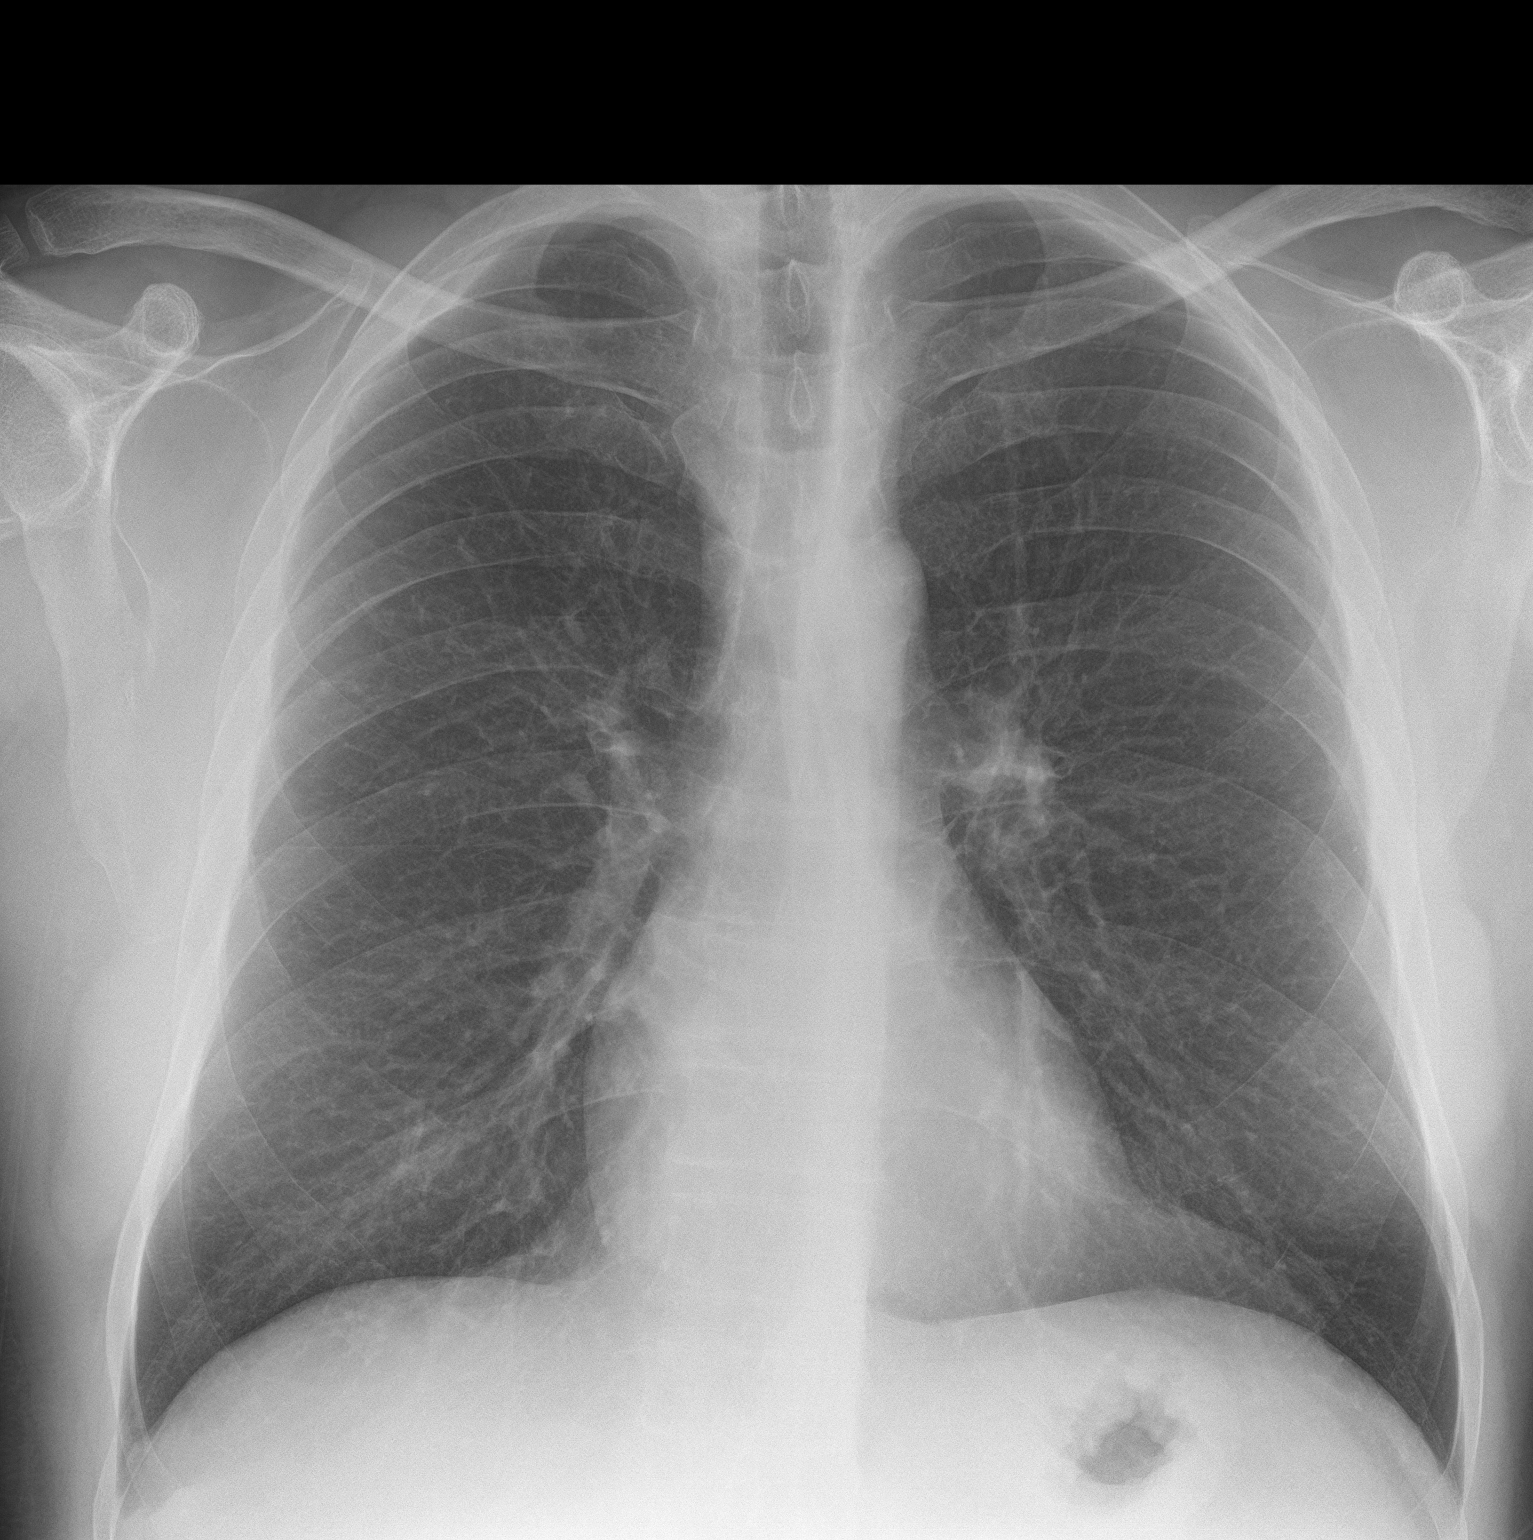
[im 2/2]
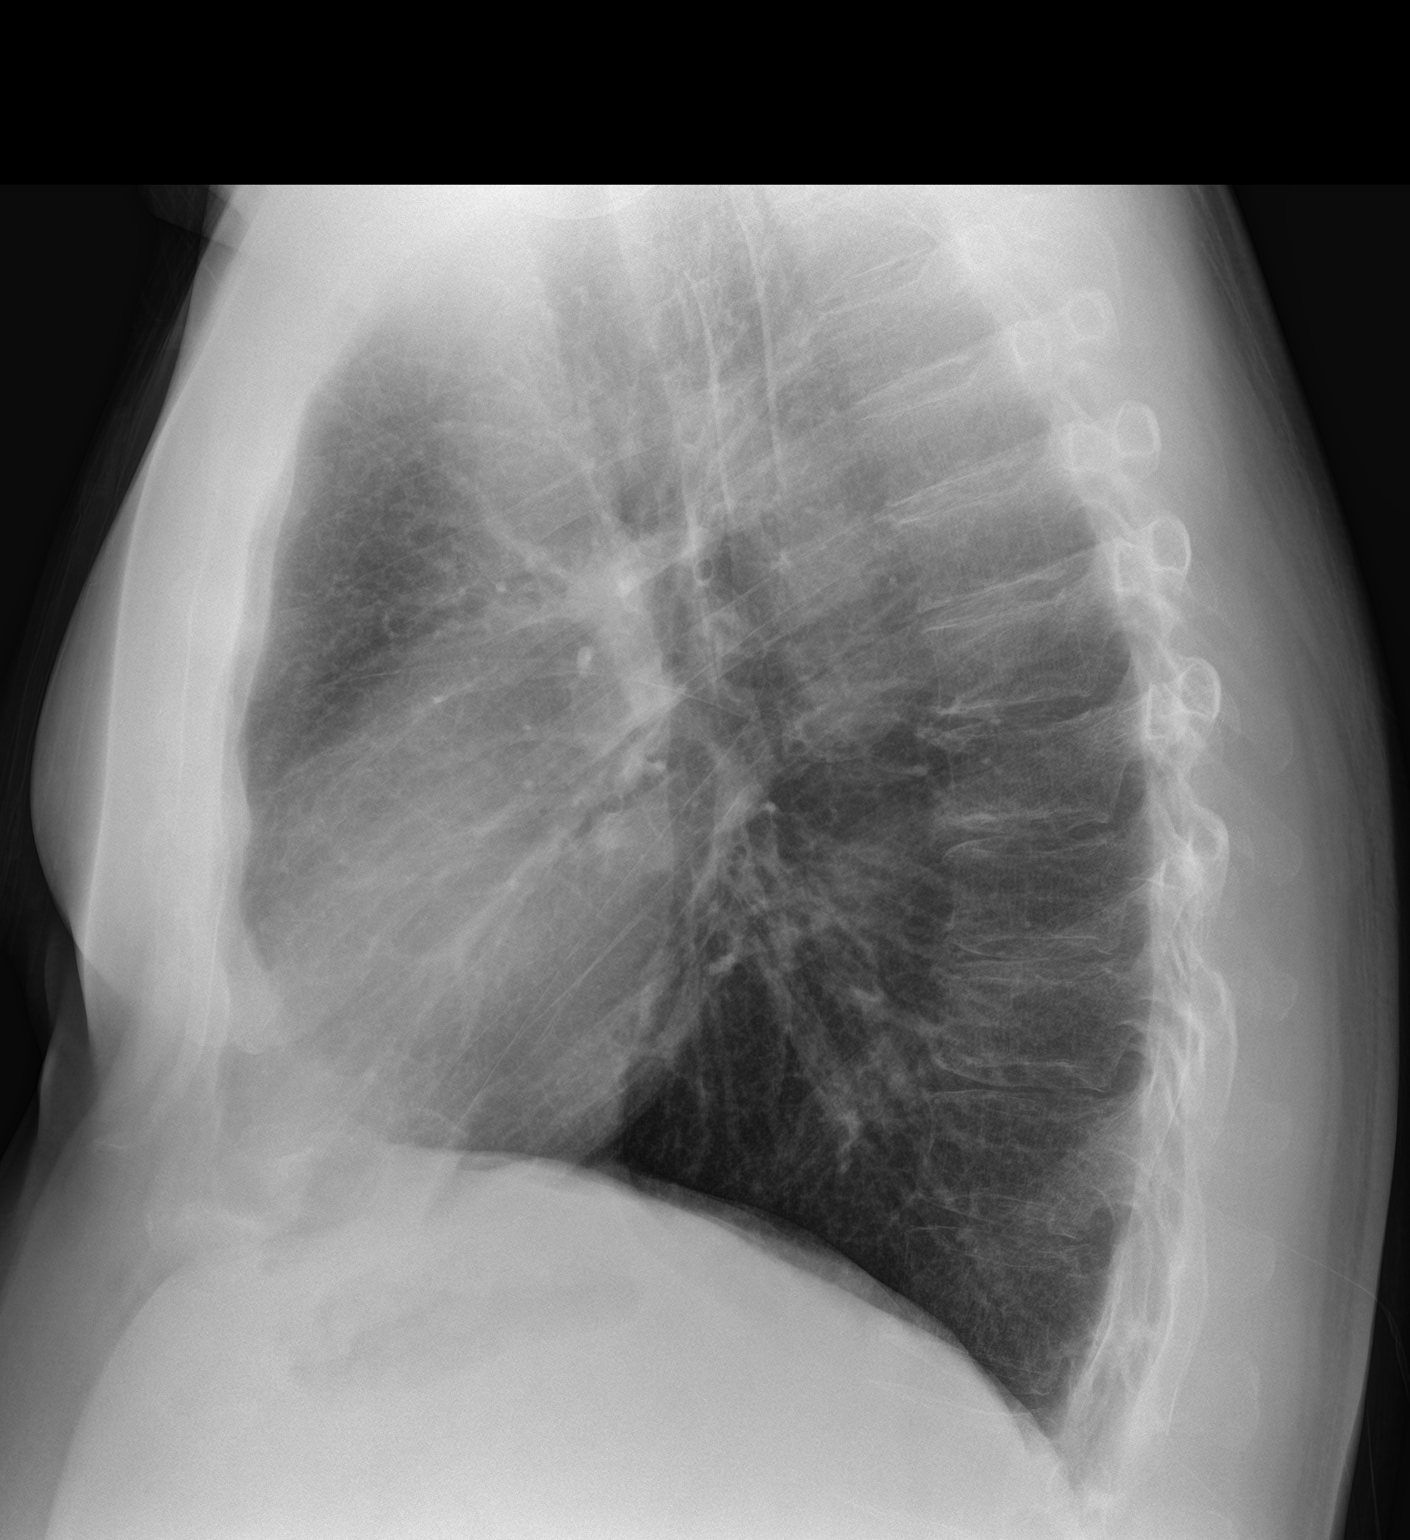

[2 of 2 positions shown; findings below may reference images not displayed]

FINDINGS: The heart size and mediastinal contours are within normal limits.
Both lungs are clear. The visualized skeletal structures are
unremarkable.
IMPRESSION: No active cardiopulmonary disease.

## 2022-01-28 NOTE — Progress Notes (Signed)
Office Visit Note  Patient: Sean Bates             Date of Birth: Jul 20, 1971           MRN: [id number]             PCP: Doreen Beam, FNP Referring: Jettie Booze, NP Visit Date: 02/11/2022 Occupation: _0 @  Subjective:  Pain in multiple joints  History of Present Illness: Sean Bates is a 51 y.o. male seen in consultation per request of his PCP for evaluation of joint pain.  According the patient in April 2018 he had a back injury and was diagnosed with disc herniation.  He is had 3 epidural injections since then.  He has to be careful with his back and has intermittent discomfort.  In 2021 he was diagnosed with colon cancer and underwent colectomy and colostomy with pouch placement.  He states 3 weeks after the surgery he started having increased joint pain and stiffness.  He had a lot of fluid retention and was given Lasix for a while but it did not help much with the swelling and the Lasix was discontinued.  He continues to have pain and stiffness in his joints.  He states that stiffness last all day.  He describes discomfort in his bilateral elbows, bilateral hands, bilateral knee joints and hands feet.  He notices swelling in his joints.  He denies any history of psoriasis, Planter fasciitis or Achilles tendinitis.  There is no history of Crohn's disease or ulcerative colitis.  There is no family history of autoimmune disease.  He has 2 siblings and 1 child who do not have any autoimmune disease.  Activities of Daily Living:  Patient reports morning stiffness for all day. Patient Reports nocturnal pain.  Difficulty dressing/grooming: Denies Difficulty climbing stairs: Denies Difficulty getting out of chair: Reports Difficulty using hands for taps, buttons, cutlery, and/or writing: Denies  Review of Systems  Constitutional:  Positive for fatigue.  HENT:  Negative for mouth sores, mouth dryness and nose dryness.   Eyes:  Negative for pain, itching and dryness.   Respiratory:  Negative for shortness of breath and difficulty breathing.   Cardiovascular:  Negative for chest pain and palpitations.  Gastrointestinal:  Positive for diarrhea. Negative for blood in stool and constipation.  Endocrine: Negative for increased urination.  Genitourinary:  Negative for difficulty urinating.  Musculoskeletal:  Positive for joint pain, joint pain, joint swelling, myalgias, morning stiffness, muscle tenderness and myalgias.  Skin:  Positive for color change. Negative for rash, redness and sensitivity to sunlight.  Allergic/Immunologic: Negative for susceptible to infections.  Neurological:  Positive for headaches. Negative for dizziness, numbness, memory loss and weakness.       History of sinus headaches and migraines  Hematological:  Negative for bruising/bleeding tendency.  Psychiatric/Behavioral:  Negative for depressed mood, confusion and sleep disturbance. The patient is not nervous/anxious.    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   Familial polyposis 09/20/2014   Current tobacco use 11/16/2006    Past Medical History:  Diagnosis Date   Cancer [facility])    history of colon   Malignant neoplasm of colon ([place]) 03/22/2020    Family History  Problem Relation Age of Onset   Healthy Mother    Diabetes Father    Colon polyps Father    Cancer Paternal Aunt    Asthma Maternal Grandmother    Cancer Paternal Grandmother    Healthy  Son    Adrenal disorder Neg Hx    Past Surgical History:  Procedure Laterality Date   COLON SURGERY  10/2004   COLON SURGERY     04/2005, 01/15/2020   Social History   Social History Narrative   Not on file   Immunization History  Administered Date(s) Administered   Tdap 11/30/2007     Objective: Vital Signs: BP 132/89 (BP Location: Right Arm, Patient Position: Sitting, Cuff Size: Large)   Pulse 66   Ht _0  (1.956 m)   Wt 287 lb 3.2 oz (130.3 kg)   BMI 34.06 kg/m    Physical Exam Vitals and nursing note reviewed.  Constitutional:      Appearance: He is well-developed.  HENT:     Head: Normocephalic and atraumatic.  Eyes:     Conjunctiva/sclera: Conjunctivae normal.     Pupils: Pupils are equal, round, and reactive to light.  Cardiovascular:     Rate and Rhythm: Normal rate and regular rhythm.     Heart sounds: Normal heart sounds.  Pulmonary:     Effort: Pulmonary effort is normal.     Breath sounds: Normal breath sounds.  Abdominal:     General: Bowel sounds are normal.     Palpations: Abdomen is soft.  Musculoskeletal:     Cervical back: Normal range of motion and neck supple.  Skin:    General: Skin is warm and dry.     Capillary Refill: Capillary refill takes less than 2 seconds.  Neurological:     Mental Status: He is alert and oriented to person, place, and time.  Psychiatric:        Behavior: Behavior normal.     Musculoskeletal Exam: C-spine, thoracic and lumbar spine were in good range of motion.  He had no point tenderness over the lumbar region.  There was no SI joint tenderness.  Shoulder joints, elbow joints, wrist joints, MCPs PIPs and DIPs with good range of motion.  He had tenderness over bilateral lateral epicondyle region.  Bilateral PIP and DIP thickening was noted.  No synovitis was noted.  Hip joints and knee joints with good range of motion.  No warmth swelling or effusion was noted.  There was no tenderness over ankles or MTPs.  He has  some tenderness in the midfoot.  There was no plantar fasciitis or Achilles tendinitis.  CDAI Exam: CDAI Score: -- Patient Global: --; Provider Global: -- Swollen: --; Tender: -- Joint Exam 02/11/2022   No joint exam has been documented for this visit   There is currently no information documented on the homunculus. Go to the Rheumatology activity and complete the homunculus joint exam.  Investigation: No additional findings.  Imaging: XR Foot 2 Views Left  Result Date: 02/11/2022 First MTP, PIP and DIP narrowing was noted.  Subacute fracture was noted in the base of the second metatarsal with partial healing.  No tibiotalar or subtalar joint space narrowing was noted.  Inferior  calcaneal spur was noted. Impression: These findings are consistent with osteoarthritis of the foot with subacute fracture of the second metatarsal.  XR Foot 2 Views Right  Result Date: 02/11/2022 PIP and DIP narrowing was noted.  First MTP narrowing was noted.  No intertarsal, tibiotalar or subtalar joint space narrowing was noted.  No erosive changes were noted. Impression: These findings are consistent with osteoarthritis of the foot.  XR Hand 2 View Left  Result Date: 02/11/2022 CMC, PIP and DIP narrowing was noted.  No MCP, intercarpal or radiocarpal joint space narrowing was noted.  No erosive changes were noted. Impression: These findings are consistent with osteoarthritis of the hand.  XR Hand 2 View Right  Result Date: 02/11/2022 CMC, PIP and DIP narrowing was noted.  No MCP, intercarpal or radiocarpal joint space narrowing was noted.  No erosive changes were noted. Impression: These findings are consistent with osteoarthritis of the hand.  XR KNEE 3 VIEW LEFT  Result Date: 02/11/2022 Mild medial compartment narrowing and mild patellofemoral narrowing was noted.  No chondrocalcinosis was noted. Impression: These findings are consistent with mild osteoarthritis and mild chondromalacia patella.  XR  KNEE 3 VIEW RIGHT  Result Date: 02/11/2022 Mild to moderate medial compartment narrowing was noted.  Mild patellofemoral narrowing was noted. Impression: These findings are consistent with mild to moderate osteoarthritis and mild chondromalacia patella.   Recent Labs: Lab Results  Component Value Date   WBC 7.3 08/27/2020   HGB 12.8 (L) 08/27/2020   PLT 274 08/27/2020   NA 138 08/27/2020   K 4.0 08/27/2020   CL 105 08/27/2020   CO2 23 08/27/2020   GLUCOSE 136 (H) 08/27/2020   BUN 17 08/27/2020   CREATININE 1.16 08/27/2020   BILITOT 0.4 08/27/2020   ALKPHOS 77 08/27/2020   AST 32 08/27/2020   ALT 38 08/27/2020   PROT 7.6 08/27/2020   ALBUMIN 3.8 08/27/2020   CALCIUM 9.1 08/27/2020   GFRAA 91 05/13/2020    Speciality Comments: No specialty comments available.  Procedures:  No procedures performed Allergies: Yellow jacket venom   Assessment / Plan:     Visit Diagnoses: Polyarthralgia -he complains of pain and discomfort in multiple joints for the last 2 years.  He also has significant morning stiffness.  He notices swelling in his joints and fluid retention.  I did not see synovitis on the examination today.  Clinical findings are consistent with osteoarthritis.  Detailed counsel regarding osteoarthritis was provided.  Joint protection muscle strengthening was discussed.  09/09/21:Uric acid 6.9, ANA negative, RF 33.6, ESR 11  Pain in both hands -he complains of pain and discomfort in his bilateral hands.  Bilateral PIP and DIP thickening was noted.  No synovitis was noted.  Clinical findings were consistent with osteoarthritis.  A handout on exercises was given.  Plan: XR Hand 2 View Right, XR Hand 2 View Left, x-ray of bilateral hands were consistent with osteoarthritis.  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  He has been having discomfort over the lateral  epicondyle region.  These findings are consistent with lateral epicondylitis.  I gave him a handout on exercises.  Chronic pain of both knees -he complains of pain and discomfort in his bilateral knee joints.  No warmth swelling or effusion was noted.  A handout on lower extremity exercises was given.  Plan: XR KNEE 3 VIEW RIGHT, XR KNEE 3  VIEW LEFT.  X-rays showed mild osteoarthritis and mild chondromalacia patella.  No chondrocalcinosis was noted.  Pain in both feet -he complains of pain in the midfoot and also in his toes.  No synovitis was noted.  There was no evidence of Planter fasciitis or Achilles tendinitis.  Plan: XR Foot 2 Views Right, XR Foot 2 Views Left.  The x-rays were consistent with osteoarthritis.  His subacute fracture was noted in the base of the second metatarsal.  I discussed the findings with the patient.  I also offered referral to orthopedic surgeon which she declined.  He states he will wrap his foot and will wear tennis shoes.  He is not having much discomfort in his left foot.  I also had a detailed discussion regarding stress fracture and possible vitamin D deficiency.  We will check vitamin D level.  Decrease absorption of nutrients was also discussed with intestinal surgery.  We may obtain additional labs at the follow-up visit and may consider DEXA scan.  Vitamin D deficiency-  DDD (degenerative disc disease), lumbar - 04/18 L4-5 large disc bulge and protrusion/ extrusion displaces the traversing RIGHT L5 nerve. Moderate L4-5 canal stenosis. Grade 1 L4-5 retrolisthesis.  Patient states that he had 3 epidural injections in the past.  He has intermittent lower back discomfort.    Raynaud's phenomenon without gangrene-he gives history of mild Raynaud's symptoms during the winter months.  No sclerodactyly, nailbed capillary changes or Telengectesia's were noted.  ANA was negative.  Other medical problems are listed as follows:  Cancer of left colon ([place]) - 2006,s/p  colectomy, CTX and RTX.He is followed by oncologist.  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 Renato Shin in the past.  Former smoker - quit smoking 01/2020. Smked 1PPD X 30 years.  Orders: Orders Placed This Encounter  Procedures   XR Hand 2 View Right   XR Hand 2 View Left   XR KNEE 3 VIEW RIGHT   XR KNEE 3 VIEW LEFT   XR Foot 2 Views Right   XR Foot 2 Views Left   Rheumatoid factor   Cyclic citrul peptide antibody, IgG   VITAMIN D 25 Hydroxy (Vit-D Deficiency, Fractures)   No orders of the defined types were placed in this encounter.   Follow-Up Instructions: Return for Polyarthralgia.   Bo Merino, MD  Note - This record has been created using Editor, commissioning.  Chart creation errors have been sought, but may not always  have been located. Such creation errors do not reflect on  the standard of medical care.

## 2022-02-11 ENCOUNTER — Ambulatory Visit: Payer: Federal, State, Local not specified - PPO | Admitting: Rheumatology

## 2022-02-11 ENCOUNTER — Ambulatory Visit (INDEPENDENT_AMBULATORY_CARE_PROVIDER_SITE_OTHER): Payer: Federal, State, Local not specified - PPO

## 2022-02-11 ENCOUNTER — Encounter: Payer: Self-pay | Admitting: Rheumatology

## 2022-02-11 VITALS — BP 132/89 | HR 66 | Ht 77.0 in | Wt 287.2 lb

## 2022-02-11 DIAGNOSIS — M25562 Pain in left knee: Secondary | ICD-10-CM

## 2022-02-11 DIAGNOSIS — M25561 Pain in right knee: Secondary | ICD-10-CM

## 2022-02-11 DIAGNOSIS — M7711 Lateral epicondylitis, right elbow: Secondary | ICD-10-CM

## 2022-02-11 DIAGNOSIS — M5136 Other intervertebral disc degeneration, lumbar region: Secondary | ICD-10-CM

## 2022-02-11 DIAGNOSIS — Z720 Tobacco use: Secondary | ICD-10-CM

## 2022-02-11 DIAGNOSIS — M79672 Pain in left foot: Secondary | ICD-10-CM

## 2022-02-11 DIAGNOSIS — D126 Benign neoplasm of colon, unspecified: Secondary | ICD-10-CM

## 2022-02-11 DIAGNOSIS — M79641 Pain in right hand: Secondary | ICD-10-CM

## 2022-02-11 DIAGNOSIS — D1391 Familial adenomatous polyposis: Secondary | ICD-10-CM

## 2022-02-11 DIAGNOSIS — E279 Disorder of adrenal gland, unspecified: Secondary | ICD-10-CM

## 2022-02-11 DIAGNOSIS — G8929 Other chronic pain: Secondary | ICD-10-CM

## 2022-02-11 DIAGNOSIS — K91858 Other complications of intestinal pouch: Secondary | ICD-10-CM

## 2022-02-11 DIAGNOSIS — M2550 Pain in unspecified joint: Secondary | ICD-10-CM

## 2022-02-11 DIAGNOSIS — E278 Other specified disorders of adrenal gland: Secondary | ICD-10-CM

## 2022-02-11 DIAGNOSIS — E559 Vitamin D deficiency, unspecified: Secondary | ICD-10-CM

## 2022-02-11 DIAGNOSIS — M79671 Pain in right foot: Secondary | ICD-10-CM

## 2022-02-11 DIAGNOSIS — M79642 Pain in left hand: Secondary | ICD-10-CM | POA: Diagnosis not present

## 2022-02-11 DIAGNOSIS — M84375A Stress fracture, left foot, initial encounter for fracture: Secondary | ICD-10-CM | POA: Diagnosis not present

## 2022-02-11 DIAGNOSIS — S92325A Nondisplaced fracture of second metatarsal bone, left foot, initial encounter for closed fracture: Secondary | ICD-10-CM

## 2022-02-11 DIAGNOSIS — M51369 Other intervertebral disc degeneration, lumbar region without mention of lumbar back pain or lower extremity pain: Secondary | ICD-10-CM

## 2022-02-11 DIAGNOSIS — I7300 Raynaud's syndrome without gangrene: Secondary | ICD-10-CM

## 2022-02-11 DIAGNOSIS — C20 Malignant neoplasm of rectum: Secondary | ICD-10-CM

## 2022-02-11 DIAGNOSIS — Z87891 Personal history of nicotine dependence: Secondary | ICD-10-CM

## 2022-02-11 DIAGNOSIS — M7712 Lateral epicondylitis, left elbow: Secondary | ICD-10-CM

## 2022-02-11 DIAGNOSIS — C186 Malignant neoplasm of descending colon: Secondary | ICD-10-CM

## 2022-02-11 NOTE — Patient Instructions (Signed)
Hand Exercises Hand exercises can be helpful for almost anyone. These exercises can strengthen the hands, improve flexibility and movement, and increase blood flow to the hands. These results can make work and daily tasks easier. Hand exercises can be especially helpful for people who have joint pain from arthritis or have nerve damage from overuse (carpal tunnel syndrome). These exercises can also help people who have injured a hand. Exercises Most of these hand exercises are gentle stretching and motion exercises. It is usually safe to do them often throughout the day. Warming up your hands before exercise may help to reduce stiffness. You can do this with gentle massage or by placing your hands in warm water for 10-15 minutes. It is normal to feel some stretching, pulling, tightness, or mild discomfort as you begin new exercises. This will gradually improve. Stop an exercise right away if you feel sudden, severe pain or your pain gets worse. Ask your health care provider which exercises are best for you. Knuckle bend or "claw" fist  Stand or sit with your arm, hand, and all five fingers pointed straight up. Make sure to keep your wrist straight during the exercise. Gently bend your fingers down toward your palm until the tips of your fingers are touching the top of your palm. Keep your big knuckle straight and just bend the small knuckles in your fingers. Hold this position for __________ seconds. Straighten (extend) your fingers back to the starting position. Repeat this exercise 5-10 times with each hand. Full finger fist  Stand or sit with your arm, hand, and all five fingers pointed straight up. Make sure to keep your wrist straight during the exercise. Gently bend your fingers into your palm until the tips of your fingers are touching the middle of your palm. Hold this position for __________ seconds. Extend your fingers back to the starting position, stretching every joint fully. Repeat  this exercise 5-10 times with each hand. Straight fist Stand or sit with your arm, hand, and all five fingers pointed straight up. Make sure to keep your wrist straight during the exercise. Gently bend your fingers at the big knuckle, where your fingers meet your hand, and the middle knuckle. Keep the knuckle at the tips of your fingers straight and try to touch the bottom of your palm. Hold this position for __________ seconds. Extend your fingers back to the starting position, stretching every joint fully. Repeat this exercise 5-10 times with each hand. Tabletop  Stand or sit with your arm, hand, and all five fingers pointed straight up. Make sure to keep your wrist straight during the exercise. Gently bend your fingers at the big knuckle, where your fingers meet your hand, as far down as you can while keeping the small knuckles in your fingers straight. Think of forming a tabletop with your fingers. Hold this position for __________ seconds. Extend your fingers back to the starting position, stretching every joint fully. Repeat this exercise 5-10 times with each hand. Finger spread  Place your hand flat on a table with your palm facing down. Make sure your wrist stays straight as you do this exercise. Spread your fingers and thumb apart from each other as far as you can until you feel a gentle stretch. Hold this position for __________ seconds. Bring your fingers and thumb tight together again. Hold this position for __________ seconds. Repeat this exercise 5-10 times with each hand. Making circles  Stand or sit with your arm, hand, and all five fingers pointed   straight up. Make sure to keep your wrist straight during the exercise. Make a circle by touching the tip of your thumb to the tip of your index finger. Hold for __________ seconds. Then open your hand wide. Repeat this motion with your thumb and each finger on your hand. Repeat this exercise 5-10 times with each hand. Thumb  motion  Sit with your forearm resting on a table and your wrist straight. Your thumb should be facing up toward the ceiling. Keep your fingers relaxed as you move your thumb. Lift your thumb up as high as you can toward the ceiling. Hold for __________ seconds. Bend your thumb across your palm as far as you can, reaching the tip of your thumb for the small finger (pinkie) side of your palm. Hold for __________ seconds. Repeat this exercise 5-10 times with each hand. Grip strengthening  Hold a stress ball or other soft ball in the middle of your hand. Slowly increase the pressure, squeezing the ball as much as you can without causing pain. Think of bringing the tips of your fingers into the middle of your palm. All of your finger joints should bend when doing this exercise. Hold your squeeze for __________ seconds, then relax. Repeat this exercise 5-10 times with each hand. Contact a health care provider if: Your hand pain or discomfort gets much worse when you do an exercise. Your hand pain or discomfort does not improve within 2 hours after you exercise. If you have any of these problems, stop doing these exercises right away. Do not do them again unless your health care provider says that you can. Get help right away if: You develop sudden, severe hand pain or swelling. If this happens, stop doing these exercises right away. Do not do them again unless your health care provider says that you can. This information is not intended to replace advice given to you by your health care provider. Make sure you discuss any questions you have with your health care provider. Document Revised: 12/19/2020 Document Reviewed: 12/19/2020 [employer] Patient Education  [place]. Knee Exercises Ask your health care provider which exercises are safe for you. Do exercises exactly as told by your health care provider and adjust them as directed. It is normal to feel mild stretching, pulling, tightness, or  discomfort as you do these exercises. Stop right away if you feel sudden pain or your pain gets worse. Do not begin these exercises until told by your health care provider. Stretching and range-of-motion exercises These exercises warm up your muscles and joints and improve the movement and flexibility of your knee. These exercises also help to relieve pain and swelling. Knee extension, prone  Lie on your abdomen (prone position) on a bed. Place your left / right knee just beyond the edge of the surface so your knee is not on the bed. You can put a towel under your left / right thigh just above your kneecap for comfort. Relax your leg muscles and allow gravity to straighten your knee (extension). You should feel a stretch behind your left / right knee. Hold this position for __________ seconds. Scoot up so your knee is supported between repetitions. Repeat __________ times. Complete this exercise __________ times a day. Knee flexion, active  Lie on your back with both legs straight. If this causes back discomfort, bend your left / right knee so your foot is flat on the floor. Slowly slide your left / right heel back toward your buttocks. Stop when  you feel a gentle stretch in the front of your knee or thigh (flexion). Hold this position for __________ seconds. Slowly slide your left / right heel back to the starting position. Repeat __________ times. Complete this exercise __________ times a day. Quadriceps stretch, prone  Lie on your abdomen on a firm surface, such as a bed or padded floor. Bend your left / right knee and hold your ankle. If you cannot reach your ankle or pant leg, loop a belt around your foot and grab the belt instead. Gently pull your heel toward your buttocks. Your knee should not slide out to the side. You should feel a stretch in the front of your thigh and knee (quadriceps). Hold this position for __________ seconds. Repeat __________ times. Complete this exercise  __________ times a day. Hamstring, supine  Lie on your back (supine position). Loop a belt or towel over the ball of your left / right foot. The ball of your foot is on the walking surface, right under your toes. Straighten your left / right knee and slowly pull on the belt to raise your leg until you feel a gentle stretch behind your knee (hamstring). Do not let your knee bend while you do this. Keep your other leg flat on the floor. Hold this position for __________ seconds. Repeat __________ times. Complete this exercise __________ times a day. Strengthening exercises These exercises build strength and endurance in your knee. Endurance is the ability to use your muscles for a long time, even after they get tired. Quadriceps, isometric This exercise strengthens the muscles in front of your thigh (quadriceps) without moving your knee joint (isometric). Lie on your back with your left / right leg extended and your other knee bent. Put a rolled towel or small pillow under your knee if told by your health care provider. Slowly tense the muscles in the front of your left / right thigh. You should see your kneecap slide up toward your hip or see increased dimpling just above the knee. This motion will push the back of the knee toward the floor. For __________ seconds, hold the muscle as tight as you can without increasing your pain. Relax the muscles slowly and completely. Repeat __________ times. Complete this exercise __________ times a day. Straight leg raises This exercise strengthens the muscles in front of your thigh (quadriceps) and the muscles that move your hips (hip flexors). Lie on your back with your left / right leg extended and your other knee bent. Tense the muscles in the front of your left / right thigh. You should see your kneecap slide up or see increased dimpling just above the knee. Your thigh may even shake a bit. Keep these muscles tight as you raise your leg 4-6 inches  (10-15 cm) off the floor. Do not let your knee bend. Hold this position for __________ seconds. Keep these muscles tense as you lower your leg. Relax your muscles slowly and completely after each repetition. Repeat __________ times. Complete this exercise __________ times a day. Hamstring, isometric  Lie on your back on a firm surface. Bend your left / right knee about __________ degrees. Dig your left / right heel into the surface as if you are trying to pull it toward your buttocks. Tighten the muscles in the back of your thighs (hamstring) to "dig" as hard as you can without increasing any pain. Hold this position for __________ seconds. Release the tension gradually and allow your muscles to relax completely for __________ seconds  after each repetition. Repeat __________ times. Complete this exercise __________ times a day. Hamstring curls If told by your health care provider, do this exercise while wearing ankle weights. Begin with __________lb / kg weights. Then increase the weight by 1 lb (0.5 kg) increments. Do not wear ankle weights that are more than __________lb / kg. Lie on your abdomen with your legs straight. Bend your left / right knee as far as you can without feeling pain. Keep your hips flat against the floor. Hold this position for __________ seconds. Slowly lower your leg to the starting position. Repeat __________ times. Complete this exercise __________ times a day. Squats This exercise strengthens the muscles in front of your thigh and knee (quadriceps). Stand in front of a table, with your feet and knees pointing straight ahead. You may rest your hands on the table for balance but not for support. Slowly bend your knees and lower your hips like you are going to sit in a chair. Keep your weight over your heels, not over your toes. Keep your lower legs upright so they are parallel with the table legs. Do not let your hips go lower than your knees. Do not bend lower  than told by your health care provider. If your knee pain increases, do not bend as low. Hold the squat position for __________ seconds. Slowly push with your legs to return to standing. Do not use your hands to pull yourself to standing. Repeat __________ times. Complete this exercise __________ times a day. Wall slides This exercise strengthens the muscles in front of your thigh and knee (quadriceps). Lean your back against a smooth wall or door, and walk your feet out 18-24 inches (46-61 cm) from it. Place your feet hip-width apart. Slowly slide down the wall or door until your knees bend __________ degrees. Keep your knees over your heels, not over your toes. Keep your knees in line with your hips. Hold this position for __________ seconds. Repeat __________ times. Complete this exercise __________ times a day. Straight leg raises, side-lying This exercise strengthens the muscles that rotate the leg at the hip and move it away from your body (hip abductors). Lie on your side with your left / right leg in the top position. Lie so your head, shoulder, knee, and hip line up. You may bend your bottom knee to help you keep your balance. Roll your hips slightly forward so your hips are stacked directly over each other and your left / right knee is facing forward. Leading with your heel, lift your top leg 4-6 inches (10-15 cm). You should feel the muscles in your outer hip lifting. Do not let your foot drift forward. Do not let your knee roll toward the ceiling. Hold this position for __________ seconds. Slowly return your leg to the starting position. Let your muscles relax completely after each repetition. Repeat __________ times. Complete this exercise __________ times a day. Straight leg raises, prone This exercise stretches the muscles that move your hips away from the front of the pelvis (hip extensors). Lie on your abdomen on a firm surface. You can put a pillow under your hips if that  is more comfortable. Tense the muscles in your buttocks and lift your left / right leg about 4-6 inches (10-15 cm). Keep your knee straight as you lift your leg. Hold this position for __________ seconds. Slowly lower your leg to the starting position. Let your leg relax completely after each repetition. Repeat __________ times. Complete this exercise   __________ times a day. This information is not intended to replace advice given to you by your health care provider. Make sure you discuss any questions you have with your health care provider. Document Revised: 05/13/2021 Document Reviewed: 05/13/2021 Elsevier Patient Education  [place]. Elbow and Forearm Exercises Ask your health care provider which exercises are safe for you. Do exercises exactly as told by your health care provider and adjust them as directed. It is normal to feel mild stretching, pulling, tightness, or discomfort as you do these exercises. Stop right away if you feel sudden pain or your pain gets worse. Do not begin these exercises until told by your health care provider. Range-of-motion exercises These exercises warm up your muscles and joints and improve the movement and flexibility of your injured elbow and forearm. The exercises also help to relieve pain, numbness, and tingling. These exercises are done using the muscles in your injured elbow and forearm (active). Elbow flexion, active Hold your left / right arm at your side, and bend your elbow (flexion) as far as you can using only your arm muscles. Hold this position for __________ seconds. Slowly return to the starting position. Repeat __________ times. Complete this exercise __________ times a day. Elbow extension, active Hold your left / right arm at your side, and straighten your elbow (extension) as much as you can using only your arm muscles. Hold this position for __________ seconds. Slowly return to the starting position. Repeat __________ times.  Complete this exercise __________ times a day. Active forearm rotation, supination This is an exercise in which you turn (rotate) your forearm palm up (supination). Stand or sit with your elbows at your sides. Bend your left / right elbow to a 90-degree angle (right angle). Rotate your palm up until you feel a gentle stretch on the inside of your forearm. Hold this position for __________ seconds. Slowly return to the starting position. Repeat __________ times. Complete this exercise __________ times a day. Active forearm rotation, pronation This is an exercise in which you turn (rotate) your forearm palm down (pronation). Stand or sit with your elbows at your sides. Bend your left / right elbow to a 90-degree angle (right angle). Rotate your palm down until you feel a gentle stretch on the top of your forearm. Hold this position for __________ seconds. Slowly return to the starting position. Repeat __________ times. Complete this exercise __________ times a day. Stretching exercises These exercises warm up your muscles and joints and improve the movement and flexibility of your injured elbow and forearm. These exercises also help to relieve pain, numbness, and tingling. These exercises are done using your healthy elbow and forearm to help stretch the muscles in your injured elbow and forearm (active-assisted). Elbow flexion, active-assisted  Hold your left / right arm at your side, and bend your elbow (flexion) as much as you can using your left / right arm muscles. Use your other hand to bend your left / right elbow farther. To do this, gently push up on your forearm until you feel a gentle stretch on the back of your elbow. Hold this position for __________ seconds. Slowly return to the starting position. Repeat __________ times. Complete this exercise __________ times a day. Elbow extension, active-assisted  Hold your left / right arm at your side, and straighten your elbow  (extension) as much as you can using your left / right arm muscles. Use your other hand to straighten the left / right elbow farther. To do  this, gently push down on your forearm until you feel a gentle stretch on the inside of your elbow. Hold this position for __________ seconds. Slowly return to the starting position. Repeat __________ times. Complete this exercise __________ times a day. Active-assisted forearm rotation, supination This is an exercise in which you turn (rotate) your forearm palm up (supination). Sit with your left / right elbow bent in a 90-degree angle (right angle) with your forearm resting on a table. Keeping your upper body and shoulder still, rotate your forearm so your palm faces upward. Use your other hand to help rotate your forearm further until you feel a gentle to moderate stretch. Hold this position for __________ seconds. Slowly release the stretch and return to the starting position. Repeat __________ times. Complete this exercise __________ times a day. Active-assisted forearm rotation, pronation This is an exercise in which you turn (rotate) your forearm palm down (pronation). Sit with your left / right elbow bent in a 90-degree angle (right angle) with your forearm resting on a table. Keeping your upper body and shoulder still, rotate your forearm so your palm faces the tabletop. Use your other hand to help rotate your forearm further until you feel a gentle to moderate stretch. Hold this position for __________ seconds. Slowly release the stretch and return to the starting position. Repeat __________ times. Complete this exercise __________ times a day. Passive elbow flexion, supine Lie on your back (supine position). Extend your left / right arm up in the air, bracing it with your other hand. Let your left / right hand slowly lower toward your shoulder (passive flexion), while your elbow stays pointed toward the ceiling. You should feel a gentle stretch  along the back of your upper arm and elbow. If instructed by your health care provider, you may increase the intensity of your stretch by adding a small wrist weight or hand weight. Hold this position for __________ seconds. Slowly return to the starting position. Repeat __________ times. Complete this exercise __________ times a day. Passive elbow extension, supine  Lie on your back (supine position). Make sure that you are in a comfortable position that lets you relax your arm muscles. Place a folded towel under your left / right upper arm so your elbow and shoulder are at the same height. Straighten your left / right arm so your elbow does not rest on the bed or towel. Let the weight of your hand stretch your elbow (passive extension). Keep your arm and chest muscles relaxed. You should feel a stretch on the inside of your elbow. If told by your health care provider, you may increase the intensity of your stretch by adding a small wrist weight or hand weight. Hold this position for __________ seconds. Slowly release the stretch. Repeat __________ times. Complete this exercise __________ times a day. Strengthening exercises These exercises build strength and endurance in your elbow and forearm. Endurance is the ability to use your muscles for a long time, even after they get tired. Elbow flexion, isometric  Stand or sit up straight. Bend your left / right elbow in a 90-degree angle (right angle), and keep your forearm at the height of your waist. Your thumb should be pointed toward the ceiling (neutral forearm). Place your other hand on top of your left / right forearm. Gently push down while you resist with your left / right arm (isometric flexion). Push as hard as you can with both arms without causing any pain or movement at your left /  right elbow. Hold this position for __________ seconds. Slowly release the tension in both arms. Let your muscles relax completely before you repeat the  exercise. Repeat __________ times. Complete this exercise __________ times a day. Elbow extension, isometric  Stand or sit up straight. Place your left / right arm so your palm faces your abdomen and is at the height of your waist. Place your other hand on the underside of your left / right forearm. Gently push up while you resist with your left / right arm (isometric extension). Push as hard as you can with both arms without causing any pain or movement at your left / right elbow. Hold this position for __________ seconds. Slowly release the tension in both arms. Let your muscles relax completely before you repeat the exercise. Repeat __________ times. Complete this exercise __________ times a day. Elbow flexion with forearm palm up  Sit on a firm chair without armrests, or stand up. Place your left / right arm at your side with your elbow straight and your palm facing forward. Holding a __________weight or gripping a rubber exercise band or tubing, bend your elbow to bring your hand toward your shoulder (flexion). Hold this position for __________ seconds. Slowly return to the starting position. Repeat __________ times. Complete this exercise __________ times a day. Elbow extension, active  Sit on a firm chair without armrests, or stand up. Hold a rubber exercise band or tubing in both hands. Keeping your upper arms at your sides, bring both hands up to your left / right shoulder. Keep your left / right hand just below your other hand. Straighten your left / right elbow (extension) while keeping your other arm still. Hold this position for __________ seconds. Control the resistance of the band or tubing as you return to the starting position. Repeat __________ times. Complete this exercise __________ times a day. Forearm rotation, supination  Sit with your left / right forearm supported on a table. Your elbow should be at waist height and bent at a 90-degree angle (right  angle). Gently grasp a lightweight hammer. Rest your hand over the edge of the table with your palm facing down. Without moving your left / right elbow, slowly rotate your forearm to turn your palm up toward the ceiling (supination). Hold this position for __________ seconds. Slowly return to the starting position. Repeat __________ times. Complete this exercise __________ times a day. Forearm rotation, pronation  Sit with your left / right forearm supported on a table. Keep your elbow below shoulder height. Gently grasp a lightweight hammer. Rest your hand over the edge of the table with your palm facing up. Without moving your left / right elbow, slowly rotate your forearm to turn your palm down toward the floor (pronation). Hold this position for __________seconds. Slowly return to the starting position. Repeat __________ times. Complete this exercise __________ times a day. This information is not intended to replace advice given to you by your health care provider. Make sure you discuss any questions you have with your health care provider. Document Revised: 12/22/2018 Document Reviewed: 09/21/2018 Elsevier Patient Education  [place].

## 2022-02-13 ENCOUNTER — Other Ambulatory Visit: Payer: Self-pay | Admitting: *Deleted

## 2022-02-13 DIAGNOSIS — E559 Vitamin D deficiency, unspecified: Secondary | ICD-10-CM

## 2022-02-13 LAB — TEST AUTHORIZATION

## 2022-02-13 LAB — VITAMIN D 25 HYDROXY (VIT D DEFICIENCY, FRACTURES): Vit D, 25-Hydroxy: 28 ng/mL — ABNORMAL LOW (ref 30–100)

## 2022-02-13 LAB — CYCLIC CITRUL PEPTIDE ANTIBODY, IGG: Cyclic Citrullin Peptide Ab: <16 UNITS

## 2022-02-13 LAB — RHEUMATOID FACTOR: Rheumatoid fact SerPl-aCnc: <14 IU/mL (ref <14)

## 2022-02-13 NOTE — Telephone Encounter (Signed)
-----   Message from Ofilia Neas, PA-C sent at 02/13/2022  1:46 PM EDT ----- RF and anti-CCP negative.  Vitamin D is low-28.  Please notify the patient and send in vitamin D 50,000 units once weekly x3 months.  Recheck vitamin D in 3 months.

## 2022-02-16 MED ORDER — VITAMIN D (ERGOCALCIFEROL) 1.25 MG (50000 UNIT) PO CAPS: 50000.0000 [IU] | ORAL_CAPSULE | ORAL | 0 refills | Status: DC

## 2022-03-02 NOTE — Progress Notes (Unsigned)
Office Visit Note  Patient: Sean Bates             Date of Birth: 25-Feb-1971           MRN: [id number]             PCP: Doreen Beam, FNP Referring: Sharmon Leyden* Visit Date: 03/04/2022 Occupation: '@GUAROCC' @  Subjective:  No chief complaint on file.   History of Present Illness: Sean Bates is a 51 y.o. male ***   Activities of Daily Living:  Patient reports morning stiffness for *** {minute/hour:19697}.   Patient {ACTIONS;DENIES/REPORTS:21021675::"Denies"} nocturnal pain.  Difficulty dressing/grooming: {ACTIONS;DENIES/REPORTS:21021675::"Denies"} Difficulty climbing stairs: {ACTIONS;DENIES/REPORTS:21021675::"Denies"} Difficulty getting out of chair: {ACTIONS;DENIES/REPORTS:21021675::"Denies"} Difficulty using hands for taps, buttons, cutlery, and/or writing: {ACTIONS;DENIES/REPORTS:21021675::"Denies"}  No Rheumatology ROS completed.   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   Familial polyposis 09/20/2014   Current tobacco use 11/16/2006    Past Medical History:  Diagnosis Date   Cancer ([place])    history of colon   Malignant neoplasm of colon ([place]) 03/22/2020    Family History  Problem Relation Age of Onset   Healthy Mother    Diabetes Father    Colon polyps Father    Cancer Paternal Aunt    Asthma Maternal Grandmother    Cancer Paternal Grandmother    Healthy Son    Adrenal disorder Neg Hx    Past Surgical  History:  Procedure Laterality Date   COLON SURGERY  10/2004   COLON SURGERY     04/2005, 01/15/2020   Social History   Social History Narrative   Not on file   Immunization History  Administered Date(s) Administered   Tdap 11/30/2007     Objective: Vital Signs: There were no vitals taken for this visit.   Physical Exam   Musculoskeletal Exam: ***  CDAI Exam: CDAI Score: -- Patient Global: --; Provider Global: -- Swollen: --; Tender: -- Joint Exam 03/04/2022   No joint exam has been documented for this visit   There is currently no information documented on the homunculus. Go to the Rheumatology activity and complete the homunculus joint exam.  Investigation: No additional findings.  Imaging: XR Foot 2 Views Left  Result Date: 02/11/2022 First MTP, PIP and DIP narrowing was noted.  Subacute fracture was noted in the base of the second metatarsal with partial healing.  No tibiotalar or subtalar joint space narrowing was noted.  Inferior calcaneal spur was noted. Impression: These findings are consistent with osteoarthritis of the foot with subacute fracture of the second metatarsal.  XR Foot 2 Views Right  Result Date: 02/11/2022 PIP and DIP narrowing was noted.  First MTP narrowing was noted.  No intertarsal, tibiotalar or subtalar joint space narrowing was noted.  No erosive changes were noted. Impression: These findings are consistent with osteoarthritis of the foot.  XR KNEE 3 VIEW LEFT  Result Date: 02/11/2022 Mild medial compartment narrowing and  mild patellofemoral narrowing was noted.  No chondrocalcinosis was noted. Impression: These findings are consistent with mild osteoarthritis and mild chondromalacia patella.  XR KNEE 3 VIEW RIGHT  Result Date: 02/11/2022 Mild to moderate medial compartment narrowing was noted.  Mild patellofemoral narrowing was noted. Impression: These findings are consistent with mild to moderate osteoarthritis and mild chondromalacia  patella.  XR Hand 2 View Left  Result Date: 02/11/2022 CMC, PIP and DIP narrowing was noted.  No MCP, intercarpal or radiocarpal joint space narrowing was noted.  No erosive changes were noted. Impression: These findings are consistent with osteoarthritis of the hand.  XR Hand 2 View Right  Result Date: 02/11/2022 CMC, PIP and DIP narrowing was noted.  No MCP, intercarpal or radiocarpal joint space narrowing was noted.  No erosive changes were noted. Impression: These findings are consistent with osteoarthritis of the hand.   Recent Labs: Lab Results  Component Value Date   WBC 7.3 08/27/2020   HGB 12.8 (L) 08/27/2020   PLT 274 08/27/2020   NA 138 08/27/2020   K 4.0 08/27/2020   CL 105 08/27/2020   CO2 23 08/27/2020   GLUCOSE 136 (H) 08/27/2020   BUN 17 08/27/2020   CREATININE 1.16 08/27/2020   BILITOT 0.4 08/27/2020   ALKPHOS 77 08/27/2020   AST 32 08/27/2020   ALT 38 08/27/2020   PROT 7.6 08/27/2020   ALBUMIN 3.8 08/27/2020   CALCIUM 9.1 08/27/2020   GFRAA 91 05/13/2020   Feb 11, 2022 RF negative, anti-CCP negative, vitamin D 28 09/09/21:Uric acid 6.9, ANA negative, RF 33.6, ESR 11  Speciality Comments: No specialty comments available.  Procedures:  No procedures performed Allergies: Yellow jacket venom   Assessment / Plan:     Visit Diagnoses: No diagnosis found.  Orders: No orders of the defined types were placed in this encounter.  No orders of the defined types were placed in this encounter.   Face-to-face time spent with patient was *** minutes. Greater than 50% of time was spent in counseling and coordination of care.  Follow-Up Instructions: No follow-ups on file.   Bo Merino, MD  Note - This record has been created using Editor, commissioning.  Chart creation errors have been sought, but may not always  have been located. Such creation errors do not reflect on  the standard of medical care.

## 2022-03-04 ENCOUNTER — Ambulatory Visit (INDEPENDENT_AMBULATORY_CARE_PROVIDER_SITE_OTHER): Payer: Federal, State, Local not specified - PPO | Admitting: Physician Assistant

## 2022-03-04 ENCOUNTER — Encounter: Payer: Self-pay | Admitting: Physician Assistant

## 2022-03-04 VITALS — BP 115/69 | HR 81 | Resp 15 | Ht 77.0 in | Wt 291.2 lb

## 2022-03-04 DIAGNOSIS — M19071 Primary osteoarthritis, right ankle and foot: Secondary | ICD-10-CM

## 2022-03-04 DIAGNOSIS — S92325P Nondisplaced fracture of second metatarsal bone, left foot, subsequent encounter for fracture with malunion: Secondary | ICD-10-CM

## 2022-03-04 DIAGNOSIS — M5136 Other intervertebral disc degeneration, lumbar region: Secondary | ICD-10-CM

## 2022-03-04 DIAGNOSIS — M19042 Primary osteoarthritis, left hand: Secondary | ICD-10-CM

## 2022-03-04 DIAGNOSIS — E559 Vitamin D deficiency, unspecified: Secondary | ICD-10-CM

## 2022-03-04 DIAGNOSIS — M19041 Primary osteoarthritis, right hand: Secondary | ICD-10-CM | POA: Diagnosis not present

## 2022-03-04 DIAGNOSIS — K91858 Other complications of intestinal pouch: Secondary | ICD-10-CM

## 2022-03-04 DIAGNOSIS — M170 Bilateral primary osteoarthritis of knee: Secondary | ICD-10-CM | POA: Diagnosis not present

## 2022-03-04 DIAGNOSIS — M51369 Other intervertebral disc degeneration, lumbar region without mention of lumbar back pain or lower extremity pain: Secondary | ICD-10-CM

## 2022-03-04 DIAGNOSIS — C20 Malignant neoplasm of rectum: Secondary | ICD-10-CM

## 2022-03-04 DIAGNOSIS — M19072 Primary osteoarthritis, left ankle and foot: Secondary | ICD-10-CM

## 2022-03-04 DIAGNOSIS — M7711 Lateral epicondylitis, right elbow: Secondary | ICD-10-CM

## 2022-03-04 DIAGNOSIS — Z87891 Personal history of nicotine dependence: Secondary | ICD-10-CM

## 2022-03-04 DIAGNOSIS — C186 Malignant neoplasm of descending colon: Secondary | ICD-10-CM

## 2022-03-04 DIAGNOSIS — D126 Benign neoplasm of colon, unspecified: Secondary | ICD-10-CM

## 2022-03-04 DIAGNOSIS — M7712 Lateral epicondylitis, left elbow: Secondary | ICD-10-CM

## 2022-03-04 DIAGNOSIS — Z13820 Encounter for screening for osteoporosis: Secondary | ICD-10-CM

## 2022-03-04 DIAGNOSIS — I7300 Raynaud's syndrome without gangrene: Secondary | ICD-10-CM

## 2022-03-04 DIAGNOSIS — D1391 Familial adenomatous polyposis: Secondary | ICD-10-CM

## 2022-03-04 DIAGNOSIS — E279 Disorder of adrenal gland, unspecified: Secondary | ICD-10-CM

## 2022-03-04 DIAGNOSIS — E278 Other specified disorders of adrenal gland: Secondary | ICD-10-CM

## 2022-03-04 NOTE — Patient Instructions (Signed)
Hand Exercises Hand exercises can be helpful for almost anyone. These exercises can strengthen the hands, improve flexibility and movement, and increase blood flow to the hands. These results can make work and daily tasks easier. Hand exercises can be especially helpful for people who have joint pain from arthritis or have nerve damage from overuse (carpal tunnel syndrome). These exercises can also help people who have injured a hand. Exercises Most of these hand exercises are gentle stretching and motion exercises. It is usually safe to do them often throughout the day. Warming up your hands before exercise may help to reduce stiffness. You can do this with gentle massage or by placing your hands in warm water for 10-15 minutes. It is normal to feel some stretching, pulling, tightness, or mild discomfort as you begin new exercises. This will gradually improve. Stop an exercise right away if you feel sudden, severe pain or your pain gets worse. Ask your health care provider which exercises are best for you. Knuckle bend or "claw" fist  Stand or sit with your arm, hand, and all five fingers pointed straight up. Make sure to keep your wrist straight during the exercise. Gently bend your fingers down toward your palm until the tips of your fingers are touching the top of your palm. Keep your big knuckle straight and just bend the small knuckles in your fingers. Hold this position for __________ seconds. Straighten (extend) your fingers back to the starting position. Repeat this exercise 5-10 times with each hand. Full finger fist  Stand or sit with your arm, hand, and all five fingers pointed straight up. Make sure to keep your wrist straight during the exercise. Gently bend your fingers into your palm until the tips of your fingers are touching the middle of your palm. Hold this position for __________ seconds. Extend your fingers back to the starting position, stretching every joint fully. Repeat  this exercise 5-10 times with each hand. Straight fist Stand or sit with your arm, hand, and all five fingers pointed straight up. Make sure to keep your wrist straight during the exercise. Gently bend your fingers at the big knuckle, where your fingers meet your hand, and the middle knuckle. Keep the knuckle at the tips of your fingers straight and try to touch the bottom of your palm. Hold this position for __________ seconds. Extend your fingers back to the starting position, stretching every joint fully. Repeat this exercise 5-10 times with each hand. Tabletop  Stand or sit with your arm, hand, and all five fingers pointed straight up. Make sure to keep your wrist straight during the exercise. Gently bend your fingers at the big knuckle, where your fingers meet your hand, as far down as you can while keeping the small knuckles in your fingers straight. Think of forming a tabletop with your fingers. Hold this position for __________ seconds. Extend your fingers back to the starting position, stretching every joint fully. Repeat this exercise 5-10 times with each hand. Finger spread  Place your hand flat on a table with your palm facing down. Make sure your wrist stays straight as you do this exercise. Spread your fingers and thumb apart from each other as far as you can until you feel a gentle stretch. Hold this position for __________ seconds. Bring your fingers and thumb tight together again. Hold this position for __________ seconds. Repeat this exercise 5-10 times with each hand. Making circles  Stand or sit with your arm, hand, and all five fingers pointed   straight up. Make sure to keep your wrist straight during the exercise. Make a circle by touching the tip of your thumb to the tip of your index finger. Hold for __________ seconds. Then open your hand wide. Repeat this motion with your thumb and each finger on your hand. Repeat this exercise 5-10 times with each hand. Thumb  motion  Sit with your forearm resting on a table and your wrist straight. Your thumb should be facing up toward the ceiling. Keep your fingers relaxed as you move your thumb. Lift your thumb up as high as you can toward the ceiling. Hold for __________ seconds. Bend your thumb across your palm as far as you can, reaching the tip of your thumb for the small finger (pinkie) side of your palm. Hold for __________ seconds. Repeat this exercise 5-10 times with each hand. Grip strengthening  Hold a stress ball or other soft ball in the middle of your hand. Slowly increase the pressure, squeezing the ball as much as you can without causing pain. Think of bringing the tips of your fingers into the middle of your palm. All of your finger joints should bend when doing this exercise. Hold your squeeze for __________ seconds, then relax. Repeat this exercise 5-10 times with each hand. Contact a health care provider if: Your hand pain or discomfort gets much worse when you do an exercise. Your hand pain or discomfort does not improve within 2 hours after you exercise. If you have any of these problems, stop doing these exercises right away. Do not do them again unless your health care provider says that you can. Get help right away if: You develop sudden, severe hand pain or swelling. If this happens, stop doing these exercises right away. Do not do them again unless your health care provider says that you can. This information is not intended to replace advice given to you by your health care provider. Make sure you discuss any questions you have with your health care provider. Document Revised: 12/19/2020 Document Reviewed: 12/19/2020 Elsevier Patient Education  2023 Elsevier Inc.  

## 2022-04-14 ENCOUNTER — Encounter: Payer: Self-pay | Admitting: Podiatry

## 2022-04-14 ENCOUNTER — Ambulatory Visit: Payer: Federal, State, Local not specified - PPO | Admitting: Podiatry

## 2022-04-14 ENCOUNTER — Ambulatory Visit (INDEPENDENT_AMBULATORY_CARE_PROVIDER_SITE_OTHER): Payer: Federal, State, Local not specified - PPO

## 2022-04-14 DIAGNOSIS — M722 Plantar fascial fibromatosis: Secondary | ICD-10-CM | POA: Diagnosis not present

## 2022-04-14 DIAGNOSIS — M778 Other enthesopathies, not elsewhere classified: Secondary | ICD-10-CM

## 2022-04-14 DIAGNOSIS — S92325A Nondisplaced fracture of second metatarsal bone, left foot, initial encounter for closed fracture: Secondary | ICD-10-CM

## 2022-04-14 NOTE — Progress Notes (Signed)
Subjective:  Patient ID: Sean Bates, male    DOB: 1971-03-05,  MRN: [id number] HPI Chief Complaint  Patient presents with   Foot Pain    Dorsal forefoot left and plantar heel/arch right - aching, swelling x 4-5 months, seen rheumatologist-xrayed- said had stress fracture left, still having pain and swelling    New Patient (Initial Visit)    51 y.o. male presents with the above complaint.   ROS: Denies fever chills nausea vomiting muscle aches pains calf pain back pain chest pain shortness of breath.  States that with every step he takes he has a sharp shooting pain through the base of his second metatarsal as he points to the area.  He states that it pops and clicks when he walks.  Past Medical History:  Diagnosis Date   Cancer [facility])    history of colon   Malignant neoplasm of colon ([place]) 03/22/2020   Past Surgical History:  Procedure Laterality Date   COLON SURGERY  10/2004   COLON SURGERY     04/2005, 01/15/2020    Current Outpatient Medications:    aspirin 325 MG tablet, Take by mouth., Disp: , Rfl:    Calcium Carbonate-Vitamin D 600-400 MG-UNIT tablet, Take by mouth., Disp: , Rfl:    MAGNESIUM PO, Take by mouth daily., Disp: , Rfl:    Multiple Vitamin (MULTIVITAMIN) capsule, Take 1 capsule by mouth daily., Disp: , Rfl:    Potassium 75 MG TABS, Take by mouth., Disp: , Rfl:    Vitamin D, Ergocalciferol, (DRISDOL) 1.25 MG (50000 UNIT) CAPS capsule, Take 1 capsule (50,000 Units total) by mouth every 7 (seven) days., Disp: 12 capsule, Rfl: 0   Zinc Sulfate (ZINC 15 PO), Take by mouth., Disp: , Rfl:   Allergies  Allergen Reactions   Yellow Jacket Venom    Review of Systems Objective:  There were no vitals filed for this visit.  General: Well developed, nourished, in no acute distress, alert and oriented x3   Dermatological: Skin is warm, dry and supple bilateral. Nails x 10 are well maintained; remaining integument appears unremarkable at this time. There are no open sores,  no preulcerative lesions, no rash or signs of infection present.  Vascular: Dorsalis Pedis artery and Posterior Tibial artery pedal pulses are 2/4 bilateral with immedate capillary fill time. Pedal hair growth present. No varicosities and no lower extremity edema present bilateral.   Neruologic: Grossly intact via light touch bilateral. Vibratory intact via tuning fork bilateral. Protective threshold with Semmes Wienstein monofilament intact to all pedal sites bilateral. Patellar and Achilles deep tendon reflexes 2+ bilateral. No Babinski or clonus noted bilateral.   Musculoskeletal: No gross boney pedal deformities bilateral. No pain, crepitus, or limitation noted with foot and ankle range of motion bilateral. Muscular strength 5/5 in all groups tested bilateral.  Pain and swelling dorsal aspect second metatarsal base left foot.  Pain on palpation medial calcaneal tubercle of the right heel.  Gait: Unassisted, Nonantalgic.    Radiographs:  Radiographs taken today of the left foot demonstrate an osseously mature foot with what appears to be a fractured second metatarsal at the base left.  It appears to be displaced and noncomminuted.  But does not appear to be healing at all and this is been going on now for 4 weeks.  Assessment & Plan:   Assessment: Planter fasciitis right foot.  Left foot is painful displaced fracture second met base.  Plan: Requesting a CT of the left foot to see if this  is healing at all may need to consider surgical intervention or booting.  Offered an injection to the right foot he declined.     Sloka Volante T. [place], [place]

## 2022-04-23 ENCOUNTER — Ambulatory Visit
Admission: RE | Admit: 2022-04-23 | Discharge: 2022-04-23 | Disposition: A | Discharge status: Home / Self Care | Payer: Federal, State, Local not specified - PPO | Source: Ambulatory Visit | Attending: Podiatry | Admitting: Podiatry

## 2022-04-23 DIAGNOSIS — M19072 Primary osteoarthritis, left ankle and foot: Secondary | ICD-10-CM | POA: Diagnosis not present

## 2022-04-23 DIAGNOSIS — S92322A Displaced fracture of second metatarsal bone, left foot, initial encounter for closed fracture: Secondary | ICD-10-CM | POA: Diagnosis not present

## 2022-04-23 DIAGNOSIS — S92325A Nondisplaced fracture of second metatarsal bone, left foot, initial encounter for closed fracture: Secondary | ICD-10-CM

## 2022-05-19 ENCOUNTER — Ambulatory Visit (INDEPENDENT_AMBULATORY_CARE_PROVIDER_SITE_OTHER): Payer: Federal, State, Local not specified - PPO | Admitting: Podiatry

## 2022-05-19 DIAGNOSIS — M722 Plantar fascial fibromatosis: Secondary | ICD-10-CM | POA: Diagnosis not present

## 2022-05-19 DIAGNOSIS — S92325K Nondisplaced fracture of second metatarsal bone, left foot, subsequent encounter for fracture with nonunion: Secondary | ICD-10-CM | POA: Diagnosis not present

## 2022-05-19 NOTE — Progress Notes (Signed)
  Subjective:  Patient ID: Sean Bates, male    DOB: 1971-06-02,  MRN: [id number]  Chief Complaint  Patient presents with   Fracture      CT follow up -nonunion second metatarsal chronic pain    51 y.o. male presents with the above complaint. History confirmed with patient.  He was referred to me by Dr. Milinda Pointer.  He has a fracture of the second metatarsal in the left foot this was first discovered by his rheumatologist back in May.  He has pain in both feet most the pain is along the arch and the heels.  The left foot in the middle of the arch and over the metatarsal does not hurt that much.  He is increase his vitamin D supplementation.  He completed the CT scan  Objective:  Physical Exam: warm, good capillary refill, no trophic changes or ulcerative lesions, normal DP and PT pulses, normal sensory exam, and bilateral he has pain on palpation of the medial plantar arch and insertion of the medial band of the plantar fascia on the calcaneal tubercle, he has little to no edema or pain over the second metatarsal itself on the left foot.  IMPRESSION: 1. 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 2. Chronic, well-healed fracture deformities of the third, fourth, and fifth metatarsal proximal metaphyses. 3. Mild osteoarthritis of the left foot.     Electronically Signed   By: Davina Poke D.O.   On: 04/23/2022 16:50  Radiographs: Multiple views x-ray of the left foot: X-rays from 04/14/2022 as well as 02/11/2022 show subacute nonunited second metatarsal metaphyseal fracture Assessment:   1. 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 Bilateral plantar fasciitis      Plan:  Patient was evaluated and treated and all questions answered.  Reviewed his radiographic examinations with him as well as the images in detail.  Also discussed my clinical exam findings with him.  Most of  his pain is more consistent with his Planter fasciitis right now and I recommended home physical therapy for this.  He does have evidence of nonunited fracture on his imaging studies and I do recommend noninvasive bone stimulation.  Currently with lack of major symptoms such as pain swelling edema I do not think surgical intervention is necessary at this point.  Would like to trial bone stimulation prior to proceeding with this as he is also hesitant to proceed with any further surgery at this point.  Noninvasive ultrasound bone stimulator was ordered.  I will see him back in 6 weeks for new x-rays and recommended twice a home PT for heel pain.  Recheck vitamin D recommended as well  Return in about 6 weeks (around 06/30/2022) for new xrays left foot.

## 2022-05-19 NOTE — Patient Instructions (Signed)

## 2024-05-04 ENCOUNTER — Ambulatory Visit

## 2024-05-04 ENCOUNTER — Other Ambulatory Visit: Payer: Self-pay | Admitting: Orthopaedic Surgery

## 2024-05-04 ENCOUNTER — Other Ambulatory Visit (HOSPITAL_BASED_OUTPATIENT_CLINIC_OR_DEPARTMENT_OTHER): Payer: Self-pay | Admitting: Orthopaedic Surgery

## 2024-05-04 ENCOUNTER — Encounter (HOSPITAL_COMMUNITY): Payer: Self-pay | Admitting: Orthopaedic Surgery

## 2024-05-04 DIAGNOSIS — R2241 Localized swelling, mass and lump, right lower limb: Secondary | ICD-10-CM

## 2024-05-23 ENCOUNTER — Other Ambulatory Visit: Payer: Self-pay

## 2024-05-23 ENCOUNTER — Emergency Department (HOSPITAL_BASED_OUTPATIENT_CLINIC_OR_DEPARTMENT_OTHER)
Admission: EM | Admit: 2024-05-23 | Discharge: 2024-05-23 | Disposition: A | Discharge status: Home / Self Care | Payer: Worker's Compensation | Attending: Emergency Medicine | Admitting: Emergency Medicine

## 2024-05-23 ENCOUNTER — Ambulatory Visit (HOSPITAL_BASED_OUTPATIENT_CLINIC_OR_DEPARTMENT_OTHER)
Admission: RE | Admit: 2024-05-23 | Discharge: 2024-05-23 | Disposition: A | Discharge status: Home / Self Care | Source: Ambulatory Visit | Attending: Orthopaedic Surgery | Admitting: Orthopaedic Surgery

## 2024-05-23 DIAGNOSIS — R2241 Localized swelling, mass and lump, right lower limb: Secondary | ICD-10-CM | POA: Diagnosis present

## 2024-05-23 DIAGNOSIS — Z7901 Long term (current) use of anticoagulants: Secondary | ICD-10-CM | POA: Insufficient documentation

## 2024-05-23 DIAGNOSIS — I82401 Acute embolism and thrombosis of unspecified deep veins of right lower extremity: Secondary | ICD-10-CM | POA: Insufficient documentation

## 2024-05-23 MED ORDER — RIVAROXABAN (XARELTO) VTE STARTER PACK (15 & 20 MG): ORAL_TABLET | ORAL | 0 refills | Status: AC

## 2024-05-23 MED ORDER — RIVAROXABAN 15 MG PO TABS
15.0000 mg | ORAL_TABLET | Freq: Once | ORAL | Status: AC
  Administered 2024-05-23: 15 mg via ORAL
  Filled 2024-05-23: qty 1

## 2024-05-23 NOTE — ED Provider Notes (Signed)
 [name] Provider Note   CSN: [id number] Arrival date & time: 05/23/24  1529     Patient presents with: Leg Pain   Sean Bates is a 53 y.o. male presents today for a positive DVT ultrasound.  Patient states that he began having right lower extremity swelling and pain approximately 2 weeks ago and had a procedure on his knee at the end of July.  Patient denies shortness of breath, fever, chills, chest pain, or any other complaints at this time.  Patient denies previous history of blood clot or hormone use.    Leg Pain      Prior to Admission medications   Medication Sig Start Date End Date Taking? Authorizing Provider  RIVAROXABAN  (XARELTO ) VTE STARTER PACK (15 & 20 MG) Follow package directions: Take one 15mg  tablet by mouth twice a day. On day 22, switch to one 20mg  tablet once a day. Take with food. 05/23/24  Yes Perian Tedder N, PA-C  Calcium Carbonate-Vitamin D  600-400 MG-UNIT tablet Take by mouth. 01/17/20   [provider]  MAGNESIUM PO Take by mouth daily.    [provider]  Multiple Vitamin (MULTIVITAMIN) capsule Take 1 capsule by mouth daily.    [provider]  Potassium 75 MG TABS Take by mouth.    [provider]  Vitamin D , Ergocalciferol , (DRISDOL ) 1.25 MG (50000 UNIT) CAPS capsule Take 1 capsule (50,000 Units total) by mouth every 7 (seven) days. 02/16/22   Cheryl Waddell HERO, PA-C  Zinc Sulfate (ZINC 15 PO) Take by mouth.    [provider]    Allergies: Yellow jacket venom    Review of Systems  Cardiovascular:  Positive for leg swelling.    Updated Vital Signs BP 121/83   Pulse 75   Temp 97.8 F (36.6 C)   Resp 18   SpO2 98%   Physical Exam Vitals and nursing note reviewed.  Constitutional:      General: He is not in acute distress.    Appearance: He is well-developed.  HENT:     Head: Normocephalic and atraumatic.     Right Ear: External ear normal.     Left Ear:  External ear normal.  Eyes:     Conjunctiva/sclera: Conjunctivae normal.  Cardiovascular:     Rate and Rhythm: Normal rate and regular rhythm.     Pulses: Normal pulses.     Heart sounds: Normal heart sounds. No murmur heard. Pulmonary:     Effort: Pulmonary effort is normal. No respiratory distress.     Breath sounds: Normal breath sounds.  Abdominal:     Palpations: Abdomen is soft.     Tenderness: There is no abdominal tenderness.  Musculoskeletal:        General: Swelling and tenderness present.     Cervical back: Neck supple.     Comments: Swelling to patient's right lower extremity compared to left and tenderness to palpation of the medial aspect of the calf.  No palpable cord, erythema, ecchymosis, warmth, or induration noted on exam. +2 Dorsalis pedis pulses bilaterally.  Skin:    General: Skin is warm and dry.     Capillary Refill: Capillary refill takes less than 2 seconds.     Comments: No PCD of the right lower extremity  Neurological:     Mental Status: He is alert.  Psychiatric:        Mood and Affect: Mood normal.     (all labs ordered are listed,  but only abnormal results are displayed) Labs Reviewed - No data to display  EKG: None  Radiology: US  Venous Img Lower Unilateral Right (DVT) Result Date: 05/23/2024 CLINICAL DATA:  Leg swelling and pain EXAM: Right LOWER EXTREMITY VENOUS DOPPLER ULTRASOUND TECHNIQUE: Gray-scale sonography with graded compression, as well as color Doppler and duplex ultrasound were performed to evaluate the lower extremity deep venous systems from the level of the common femoral vein and including the common femoral, femoral, profunda femoral, popliteal and calf veins including the posterior tibial, peroneal and gastrocnemius veins when visible. The superficial great saphenous vein was also interrogated. Spectral Doppler was utilized to evaluate flow at rest and with distal augmentation maneuvers in the common femoral, femoral and popliteal  veins. COMPARISON:  08/27/2020 FINDINGS: Contralateral Common Femoral Vein: Respiratory phasicity is normal and symmetric with the symptomatic side. No evidence of thrombus. Normal compressibility. Common Femoral Vein: Positive for acute nonocclusive thrombus. Incompletely compressible. Saphenofemoral Junction: Positive for acute nonocclusive thrombus. Incompletely compressible. Profunda Femoral Vein: Positive for acute nonocclusive thrombus. Incompletely compressible. Femoral Vein: Positive for acute occlusive thrombus. Incompletely compressible. Popliteal Vein: Positive for acute nonocclusive thrombus. Incompletely compressible. Calf Veins: No evidence of thrombus. Normal compressibility and flow on color Doppler imaging. Superficial Great Saphenous Vein: No evidence of thrombus. Normal compressibility. IMPRESSION: Positive for acute right lower extremity DVT from the common femoral to the popliteal vein. These results will be called to the ordering clinician or representative by the Radiologist Assistant, and communication documented in the PACS or [employer]. Electronically Signed   By: Luke Bun M.D.   On: 05/23/2024 16:02     Procedures   Medications Ordered in the ED  Rivaroxaban  (XARELTO ) tablet 15 mg (15 mg Oral Given 05/23/24 1714)                                    Medical Decision Making Risk Prescription drug management.   This patient presents to the ED for concern of blood clot differential diagnosis includes DVT, cellulitis, dependent edema, venous insufficiency   Imaging Studies ordered:  I ordered imaging studies including DVT ultrasound I independently visualized and interpreted imaging which showed positive for acute right lower extremity DVT from the common femoral to the popliteal vein. I agree with the radiologist interpretation   Medicines ordered and prescription drug management:  I ordered medication including Xarelto     I have reviewed the patients  home medicines and have made adjustments as needed   Problem List / ED Course:  Considered for admission or further workup however patient's vital signs, physical exam, and imaging are reassuring.  Patient given first dose of Xarelto  while in the ED and prescribed Xarelto  starter pack for VTE outpatient.  Patient given ambulatory referral to DVT clinic for further evaluation workup.  Patient given return precautions.  I feel patient stable discharge at this time.       Final diagnoses:  Acute deep vein thrombosis (DVT) of right lower extremity, unspecified vein [facility])    ED Discharge Orders          Ordered    AMB Referral to Deep Vein Thrombosis Clinic        05/23/24 1707    RIVAROXABAN  (XARELTO ) VTE STARTER PACK (15 & 20 MG)        05/23/24 1707               Francis Ileana SAILOR,  PA-C 05/23/24 1744    Pamella Ozell LABOR, DO 06/01/24 [phone]

## 2024-05-23 NOTE — ED Triage Notes (Signed)
 Patient states he got an ultrasound of his right leg. States the ultrasound tech told him to come to the ER due to abnormal ultrasound. Results not yet available.

## 2024-05-23 NOTE — Discharge Instructions (Addendum)
 Today you were seen for a deep vein thrombosis.  You have been given your first dose of Xarelto , please pick up your Xarelto  starter pack from the pharmacy.  Follow-up with the DVT clinic for further evaluation workup.  Please return to the ED if you have shortness of breath, worsening pain, or uncontrollable vomiting.  Thank you for letting us  treat you today. After reviewing your imaging, I feel you are safe to go home. Please follow up with your PCP in the next several days and provide them with your records from this visit. Return to the Emergency Room if pain becomes severe or symptoms worsen.

## 2024-05-29 NOTE — Progress Notes (Unsigned)
 DVT Clinic Note  Name: Sean Bates     MRN: [id number]     DOB: 1971/07/18     Sex: male  PCP: Patient, No Pcp Per  Today's Visit: Visit Information: Initial Visit  Referred to DVT Clinic by: Emergency Department - Ileana Eck, PA-C Referred to CPP by: Dr. Gretta Reason for referral:  Chief Complaint  Patient presents with   Med Management - DVT   HISTORY OF PRESENT ILLNESS: Sean Bates is a 53 y.o. male with PMH colon and rectal cancer in 2006 s/p neoadjuvant chemoradiation and surgery who presents after diagnosis of DVT for medication management. Patient is s/p right medial meniscus root repair on 04/03/24. He developed RLE swelling and pain after the procedure. Was taking aspirin post procedure as prescribed. When he was seen by ortho 05/12/24 for follow up they recommended an ultrasound to rule out DVT. Outpatient ultrasound on 05/23/24 showed acute extensive RLE DVT. He was seen in the ED at that time and started on Xarelto  with referral to DVT Clinic for follow up. No prior history of DVT. Today, patient arrives ambulating independently. Knee is in a brace. Denies swelling above the knee. Continues to have swelling below the knee. Denies abnormal bleeding or bruising. Reports feeling concerned about risk of bleeding on a blood thinner. Denies missed doses of Xarelto . Is taking it with food. Has been elevating his leg but reports he cannot wear compression stockings due to an allergy. Continues to participate in physical therapy.   Positive Thrombotic Risk Factors: Recent surgery (within 3 months) Bleeding Risk Factors: Anticoagulant therapy  Negative Thrombotic Risk Factors: Previous VTE, Recent trauma (within 3 months), Recent admission to hospital with acute illness (within 3 months), Paralysis, paresis, or recent plaster cast immobilization of lower extremity, Central venous catheterization, Bed rest >72 hours within 3 months, Sedentary journey lasting >8 hours within 4 weeks, Pregnancy,  Within 6 weeks postpartum, Recent cesarean section (within 3 months), Estrogen therapy, Testosterone therapy, Erythropoiesis-stimulating agent, Recent COVID diagnosis (within 3 months), Active cancer, Non-malignant, chronic inflammatory condition, Known thrombophilic condition, Smoking, Older age  Rx Insurance Coverage: Commercial Rx Affordability: Reports first fill of Xarelto  was $75. Educated him that he can sign up for a copay card on Xarelto 's website to reduce cost to $10/month.  Rx Assistance Provided: Co-pay card Preferred Pharmacy: Refills sent to patient's preferred [facility]  Past Medical History:  Diagnosis Date   Cancer [facility])    history of colon   Malignant neoplasm of colon (HCC) 03/22/2020    Past Surgical History:  Procedure Laterality Date   COLON SURGERY  10/2004   COLON SURGERY     04/2005, 01/15/2020    Social History   Socioeconomic History   Marital status: Single    Spouse name: Not on file   Number of children: 1   Years of education: 14   Highest education level: Not on file  Occupational History    Employer: [employer]  Tobacco Use   Smoking status: Former    Current packs/day: 0.00    Average packs/day: 1 pack/day for 22.0 years (22.0 ttl pk-yrs)    Types: Cigarettes    Start date: 47    Quit date: 2021    Years since quitting: 4.7    Passive exposure: Never   Smokeless tobacco: Never  Vaping Use   Vaping status: Never Used  Substance and Sexual Activity   Alcohol use: Yes    Alcohol/week: 0.0 standard drinks of alcohol  Comment: occasional   Drug use: No   Sexual activity: Not on file  Other Topics Concern   Not on file  Social History Narrative   Not on file   Social Drivers of Health   Financial Resource Strain: Patient Declined (10/11/2023)   Received from [facility]   Overall Financial Resource Strain (CARDIA)    Difficulty of Paying Living Expenses: Patient declined  Food Insecurity: Patient Declined (10/11/2023)   Received  from [facility]   Hunger Vital Sign    Within the past 12 months, you worried that your food would run out before you got the money to buy more.: Patient declined    Within the past 12 months, the food you bought just didn't last and you didn't have money to get more.: Patient declined  Transportation Needs: Patient Declined (10/11/2023)   Received from [facility] - Transportation    Lack of Transportation (Medical): Patient declined    Lack of Transportation (Non-Medical): Patient declined  Physical Activity: Unknown (10/11/2023)   Received from [facility]   Exercise Vital Sign    On average, how many days per week do you engage in moderate to strenuous exercise (like a brisk walk)?: Patient declined    Minutes of Exercise per Session: Not on file  Stress: Patient Declined (10/11/2023)   Received from [facility] of Occupational [facility] - Occupational Stress Questionnaire    Feeling of Stress : Patient declined  Social Connections: Patient Declined (10/11/2023)   Received from [facility]   Social Network    How would you rate your social network (family, work, friends)?: Patient declined  Intimate Partner Violence: Patient Declined (10/11/2023)   Received from [facility]   HITS    Over the last 12 months how often did your partner physically hurt you?: Patient declined    Over the last 12 months how often did your partner insult you or talk down to you?: Patient declined    Over the last 12 months how often did your partner threaten you with physical harm?: Patient declined    Over the last 12 months how often did your partner scream or curse at you?: Patient declined    Family History  Problem Relation Age of Onset   Healthy Mother    Diabetes Father    Colon polyps Father    Cancer Paternal Aunt    Asthma Maternal Grandmother    Cancer Paternal Grandmother    Healthy Son    Adrenal disorder Neg Hx     Allergies as of 05/30/2024 -  Review Complete 05/30/2024  Allergen Reaction Noted   Yellow jacket venom  02/11/2022    Current Outpatient Medications on File Prior to Visit  Medication Sig Dispense Refill   Calcium Carbonate-Vitamin D  600-400 MG-UNIT tablet Take by mouth.     MAGNESIUM PO Take by mouth daily.     methylPREDNISolone (MEDROL DOSEPAK) 4 MG TBPK tablet Take 4 mg by mouth as directed.     Multiple Vitamin (MULTIVITAMIN) capsule Take 1 capsule by mouth daily.     Potassium 75 MG TABS Take by mouth.     RIVAROXABAN  (XARELTO ) VTE STARTER PACK (15 & 20 MG) Follow package directions: Take one 15mg  tablet by mouth twice a day. On day 22, switch to one 20mg  tablet once a day. Take with food. 51 each 0   Zinc Sulfate (ZINC 15 PO) Take by mouth.     No current  facility-administered medications on file prior to visit.   REVIEW OF SYSTEMS:  Review of Systems  Respiratory:  Negative for shortness of breath.   Cardiovascular:  Positive for leg swelling. Negative for chest pain and palpitations.  Musculoskeletal:  Positive for myalgias.  Neurological:  Negative for dizziness and tingling.   PHYSICAL EXAMINATION:  Vitals:   05/30/24 0940  BP: 121/83  Pulse: 64  SpO2: 96%  Weight: 293 lb 4.8 oz (133 kg)    Body mass index is 34.78 kg/m.  Physical Exam Vitals reviewed.  Cardiovascular:     Rate and Rhythm: Normal rate.  Pulmonary:     Effort: Pulmonary effort is normal.  Musculoskeletal:        General: No tenderness.     Right lower leg: Edema (1+) present.     Left lower leg: No edema.  Skin:    Findings: No bruising or erythema.   LABS:  CBC     Component Value Date/Time   WBC 7.3 08/27/2020 1508   RBC 4.57 08/27/2020 1508   HGB 12.8 (L) 08/27/2020 1508   HGB 14.1 05/13/2020 1511   HCT 38.8 (L) 08/27/2020 1508   HCT 41.5 05/13/2020 1511   PLT 274 08/27/2020 1508   PLT 339 05/13/2020 1511   MCV 84.9 08/27/2020 1508   MCV 87 05/13/2020 1511   MCV 91 10/08/2014 1620   MCH 28.0 08/27/2020  1508   MCHC 33.0 08/27/2020 1508   RDW 13.7 08/27/2020 1508   RDW 13.5 05/13/2020 1511   RDW 13.3 10/08/2014 1620   LYMPHSABS 1.8 08/27/2020 1508   LYMPHSABS 2.2 05/13/2020 1511   LYMPHSABS 2.6 10/08/2014 1620   MONOABS 0.6 08/27/2020 1508   MONOABS 0.8 10/08/2014 1620   EOSABS 0.2 08/27/2020 1508   EOSABS 0.2 05/13/2020 1511   EOSABS 0.2 10/08/2014 1620   BASOSABS 0.1 08/27/2020 1508   BASOSABS 0.1 05/13/2020 1511   BASOSABS 0.0 10/08/2014 1620    Hepatic Function      Component Value Date/Time   PROT 7.6 08/27/2020 1508   PROT 7.5 05/13/2020 1511   PROT 7.0 10/08/2014 1620   ALBUMIN 3.8 08/27/2020 1508   ALBUMIN 4.2 05/13/2020 1511   ALBUMIN 3.6 10/08/2014 1620   AST 32 08/27/2020 1508   AST 37 10/08/2014 1620   ALT 38 08/27/2020 1508   ALT 45 10/08/2014 1620   ALKPHOS 77 08/27/2020 1508   ALKPHOS 61 10/08/2014 1620   BILITOT 0.4 08/27/2020 1508   BILITOT 0.2 05/13/2020 1511   BILITOT 0.2 10/08/2014 1620    Renal Function   Lab Results  Component Value Date   CREATININE 1.16 08/27/2020   CREATININE 1.09 05/13/2020   CREATININE 1.34 (H) 04/03/2020    CrCl cannot be calculated (Patient's most recent lab result is older than the maximum 21 days allowed.).   VVS Vascular Lab Studies:  05/23/24 US  Venous Img Lower Unilateral Right (DVT) FINDINGS: Contralateral Common Femoral Vein: Respiratory phasicity is normal and symmetric with the symptomatic side. No evidence of thrombus. Normal compressibility.   Common Femoral Vein: Positive for acute nonocclusive thrombus. Incompletely compressible.   Saphenofemoral Junction: Positive for acute nonocclusive thrombus. Incompletely compressible.   Profunda Femoral Vein: Positive for acute nonocclusive thrombus. Incompletely compressible.   Femoral Vein: Positive for acute occlusive thrombus. Incompletely compressible.   Popliteal Vein: Positive for acute nonocclusive thrombus. Incompletely compressible.   Calf  Veins: No evidence of thrombus. Normal compressibility and flow on color Doppler imaging.   Superficial Great Saphenous  Vein: No evidence of thrombus. Normal compressibility.   IMPRESSION: Positive for acute right lower extremity DVT from the common femoral to the popliteal vein.  ASSESSMENT: Location of DVT: Right common femoral vein, Right femoral vein, Right popliteal vein Cause of DVT: provoked by a transient risk factor  Patient without prior history of DVT diagnosed with acute extensive RLE DVT on 05/23/24 and was appropriately started on Xarelto . Given the DVT extends to the common femoral vein, discussed with Dr. Gretta in clinic today. Since the clot is nonocclusive and the patient is having no symptoms above the knee, would not be a candidate for any vascular surgery intervention. Given this is the patient's first provoked DVT, will plan to anticoagulate for 3 months. Counseled patient extensively on Xarelto . Provided refills today. No barriers identified today for medication adherence or access. Encouraged him to sign up for Xarelto 's copay card online to reduce his cost from $75/month to $10/month.   PLAN: -Continue rivaroxaban  (Xarelto ) 15 mg twice daily with food for 21 days followed by 20 mg daily with food. -Expected duration of therapy: 3 months. Therapy started on 05/23/24. -Patient educated on purpose, proper use and potential adverse effects of rivaroxaban  (Xarelto ). -Discussed importance of taking medication around the same time every day. -Advised patient of medications to avoid (NSAIDs, aspirin doses >100 mg daily). -Educated that Tylenol (acetaminophen) is the preferred analgesic to lower the risk of bleeding. -Advised patient to alert all providers of anticoagulation therapy prior to starting a new medication or having a procedure. -Emphasized importance of monitoring for signs and symptoms of bleeding (abnormal bruising, prolonged bleeding, nose bleeds, bleeding from  gums, discolored urine, black tarry stools). -Educated patient to present to the ED if emergent signs and symptoms of new thrombosis occur. -Counseled patient to wear compression stockings daily, removing at night. Counseled on proper leg elevation.   Follow up: DVT Clinic as needed.  Lum Herald, PharmD, [place], CPP Deep Vein Thrombosis Clinic Clinical Pharmacist Practitioner [phone]

## 2024-05-30 ENCOUNTER — Ambulatory Visit: Payer: Worker's Compensation | Attending: Vascular Surgery | Admitting: Student-PharmD

## 2024-05-30 ENCOUNTER — Encounter: Payer: Self-pay | Admitting: Student-PharmD

## 2024-05-30 VITALS — BP 121/83 | HR 64 | Wt 293.3 lb

## 2024-05-30 DIAGNOSIS — I82411 Acute embolism and thrombosis of right femoral vein: Secondary | ICD-10-CM

## 2024-05-30 MED ORDER — RIVAROXABAN 20 MG PO TABS: 20.0000 mg | ORAL_TABLET | Freq: Every day | ORAL | 1 refills | Status: AC

## 2024-05-30 NOTE — Patient Instructions (Signed)
-  Continue rivaroxaban  (Xarelto ) 15 mg twice daily with food for 21 days in total followed by 20 mg daily with food. Take this for a total of 3 months (start date 05/23/24). -Your refills have been sent to your [facility]. You may need to call the pharmacy to ask them to fill this when you start to run low on your current supply.  -It is important to take your medication around the same time every day.  -Avoid NSAIDs like ibuprofen (Advil, Motrin) and naproxen (Aleve) as well as aspirin doses over 100 mg daily. -Tylenol (acetaminophen) is the preferred over the counter pain medication to lower the risk of bleeding. -Be sure to alert all of your health care providers that you are taking an anticoagulant prior to starting a new medication or having a procedure. -Monitor for signs and symptoms of bleeding (abnormal bruising, prolonged bleeding, nose bleeds, bleeding from gums, discolored urine, black tarry stools). If you have fallen and hit your head OR if your bleeding is severe or not stopping, seek emergency care.  -Go to the emergency room if emergent signs and symptoms of new clot occur (new or worse swelling and pain in an arm or leg, shortness of breath, chest pain, fast or irregular heartbeats, lightheadedness, dizziness, fainting, coughing up blood) or if you experience a significant color change (pale or blue) in the extremity that has the DVT.  -We recommend you wear compression stockings (20-30 mmHg) as long as you are having swelling or pain. Be sure to purchase the correct size and take them off at night.   If you have any questions or need to reschedule an appointment, please call [phone]. If you are having an emergency, call 911 or present to the nearest emergency room.   What is a DVT?  -Deep vein thrombosis (DVT) is a condition in which a blood clot forms in a vein of the deep venous system which can occur in the lower leg, thigh, pelvis, arm, or neck. This condition is serious and can  be life-threatening if the clot travels to the arteries of the lungs and causing a blockage (pulmonary embolism, PE). A DVT can also damage veins in the leg, which can lead to long-term venous disease, leg pain, swelling, discoloration, and ulcers or sores (post-thrombotic syndrome).  -Treatment may include taking an anticoagulant medication to prevent more clots from forming and the current clot from growing, wearing compression stockings, and/or surgical procedures to remove or dissolve the clot.

## 2024-08-16 ENCOUNTER — Telehealth: Payer: Self-pay | Admitting: Student-PharmD

## 2024-08-16 NOTE — Telephone Encounter (Signed)
 Patient called to see if he needed to make a follow up appointment in DVT Clinic since he is nearing the end of treatment. His symptoms have improved. Still has some swelling that he expects will persist, and I agreed that it's common for swelling to stick around after the clot has resolved when it is as extensive as his was. Encouraged continued compression and elevation to help with swelling as needed. Explained that there is no indication for repeat imaging. He can discontinue Xarelto  after he finishes 3 months. Discussed future VTE risk and importance of informing all doctors of DVT history. He confirmed understanding.

## 2024-10-04 ENCOUNTER — Telehealth: Payer: Self-pay

## 2024-10-04 NOTE — Telephone Encounter (Signed)
 Called patient to schedule him a colonoscopy since we received a referral from the [facility]. I was n't able to speak with him, so I left him a detailed message to call me back. Notify Olam once the procedure had been scheduled so she could notify the [facility].

## 2024-10-12 NOTE — Telephone Encounter (Signed)
 Patient called back and he stated that since he was also requesting an EGD for his GERD, that he was told that he needed to be seen by a provider first. His appointment is scheduled with Dr. Theophilus on 10/30/2024.

## 2024-10-12 NOTE — Telephone Encounter (Signed)
 Called patient again and had to leave him another voicemail to call me back.

## 2024-10-30 ENCOUNTER — Ambulatory Visit
